# Patient Record
Sex: Male | Born: 1950
Health system: Southern US, Community
[De-identification: ages and names within clinical notes are randomized; demographics above are authoritative.]

## PROBLEM LIST (undated history)

## (undated) DIAGNOSIS — R519 Headache, unspecified: Secondary | ICD-10-CM

## (undated) DIAGNOSIS — G8929 Other chronic pain: Secondary | ICD-10-CM

## (undated) DIAGNOSIS — R55 Syncope and collapse: Secondary | ICD-10-CM

## (undated) DIAGNOSIS — G4733 Obstructive sleep apnea (adult) (pediatric): Secondary | ICD-10-CM

## (undated) DIAGNOSIS — R51 Headache: Secondary | ICD-10-CM

## (undated) DIAGNOSIS — R251 Tremor, unspecified: Secondary | ICD-10-CM

## (undated) DIAGNOSIS — F319 Bipolar disorder, unspecified: Secondary | ICD-10-CM

## (undated) HISTORY — DX: Tremor, unspecified: R25.1

## (undated) HISTORY — DX: Obstructive sleep apnea (adult) (pediatric): G47.33

## (undated) HISTORY — PX: CHOLECYSTECTOMY: SHX55

## (undated) HISTORY — DX: Bipolar disorder, unspecified: F31.9

## (undated) HISTORY — DX: Headache, unspecified: R51.9

## (undated) HISTORY — DX: Headache: R51

## (undated) HISTORY — PX: OTHER SURGICAL HISTORY: SHX169

## (undated) HISTORY — DX: Other chronic pain: G89.29

---

## 1988-06-17 DIAGNOSIS — C439 Malignant melanoma of skin, unspecified: Secondary | ICD-10-CM

## 1988-06-17 HISTORY — PX: MELANOMA EXCISION: SHX5266

## 1988-06-17 HISTORY — DX: Malignant melanoma of skin, unspecified: C43.9

## 2000-08-15 HISTORY — PX: CHOLECYSTECTOMY: SHX55

## 2000-08-25 ENCOUNTER — Emergency Department (HOSPITAL_COMMUNITY): Admission: EM | Admit: 2000-08-25 | Discharge: 2000-08-25 | Payer: Self-pay | Admitting: Emergency Medicine

## 2000-08-25 ENCOUNTER — Encounter: Payer: Self-pay | Admitting: Emergency Medicine

## 2000-09-10 ENCOUNTER — Encounter: Payer: Self-pay | Admitting: Family Medicine

## 2000-09-10 ENCOUNTER — Encounter: Admission: RE | Admit: 2000-09-10 | Discharge: 2000-09-10 | Payer: Self-pay | Admitting: Family Medicine

## 2000-09-23 ENCOUNTER — Encounter (INDEPENDENT_AMBULATORY_CARE_PROVIDER_SITE_OTHER): Payer: Self-pay | Admitting: Specialist

## 2000-09-23 ENCOUNTER — Other Ambulatory Visit: Admission: RE | Admit: 2000-09-23 | Discharge: 2000-09-23 | Payer: Self-pay | Admitting: General Surgery

## 2001-06-17 DIAGNOSIS — K5790 Diverticulosis of intestine, part unspecified, without perforation or abscess without bleeding: Secondary | ICD-10-CM

## 2001-06-17 DIAGNOSIS — D126 Benign neoplasm of colon, unspecified: Secondary | ICD-10-CM

## 2001-06-17 HISTORY — DX: Benign neoplasm of colon, unspecified: D12.6

## 2001-06-17 HISTORY — DX: Diverticulosis of intestine, part unspecified, without perforation or abscess without bleeding: K57.90

## 2001-12-11 ENCOUNTER — Encounter (INDEPENDENT_AMBULATORY_CARE_PROVIDER_SITE_OTHER): Payer: Self-pay | Admitting: Specialist

## 2001-12-11 ENCOUNTER — Ambulatory Visit (HOSPITAL_COMMUNITY): Admission: RE | Admit: 2001-12-11 | Discharge: 2001-12-11 | Payer: Self-pay | Admitting: Gastroenterology

## 2003-03-16 ENCOUNTER — Encounter: Payer: Self-pay | Admitting: Family Medicine

## 2003-03-16 ENCOUNTER — Encounter: Admission: RE | Admit: 2003-03-16 | Discharge: 2003-03-16 | Payer: Self-pay | Admitting: Family Medicine

## 2004-01-01 ENCOUNTER — Observation Stay (HOSPITAL_COMMUNITY): Admission: RE | Admit: 2004-01-01 | Discharge: 2004-01-02 | Payer: Self-pay | Admitting: Internal Medicine

## 2006-05-13 ENCOUNTER — Encounter: Admission: RE | Admit: 2006-05-13 | Discharge: 2006-05-13 | Payer: Self-pay | Admitting: Family Medicine

## 2006-09-11 ENCOUNTER — Encounter: Admission: RE | Admit: 2006-09-11 | Discharge: 2006-09-11 | Payer: Self-pay | Admitting: Neurosurgery

## 2007-09-25 ENCOUNTER — Encounter: Admission: RE | Admit: 2007-09-25 | Discharge: 2007-09-25 | Payer: Self-pay | Admitting: Neurosurgery

## 2010-11-02 NOTE — Discharge Summary (Signed)
NAME:  Drew Padilla, Drew Padilla                        ACCOUNT NO.:  [id number]   MEDICAL RECORD NO.:  [id number]                   PATIENT TYPE:  INP   LOCATION:  0346                                 FACILITY[facility]   PHYSICIAN:  Melissa L. Ladona Ridgel, MD               DATE OF BIRTH:  09/12/1950   DATE OF ADMISSION:  01/01/2004  DATE OF DISCHARGE:  01/02/2004                                 DISCHARGE SUMMARY   PRESENTING COMPLAINT:  Painful urination and rigors.   DISCHARGE DIAGNOSES:  1. Escherichia coli urinary tract infection with possible prostatitis.  2. Resolution of rigors, decreased fever, and improved overall general     health.  Blood cultures are pending.   DISCHARGE MEDICATIONS:  1. Cipro 500 mg p.o. b.i.d. x4 weeks to treat prostatitis.  2. Adderall 30 mg long-acting in the a.m. and 20 mg of short-acting at 3     p.m.  3. Prozac 20 mg daily.  4. Multivitamin once daily.  5. Pain management is as per the patient's home regime with Motrin.   ACTIVITY:  There are no activity restrictions.   DIET:  There are no dietary restrictions.   SPECIAL INSTRUCTIONS:  If he develops fever, chills, further shakes, nausea  or vomiting, or diarrhea, he should contact Dr. Manus Gunning or return to the  walk-in clinic.   FOLLOWUP:  Follow-up appointments should be made to see Dr. Manus Gunning on  Friday or early next week.   HISTORY OF PRESENT ILLNESS:  The patient is a 60 year old white male with no  past medical history for any prostate disease who presented to the walk-in  clinic with signs and symptoms of possible UTI with sepsis.  He described  painful urination and shakes.  Was admitted to the general medical floor and  treated with IV Cipro x24 hours at which time he felt significantly  improved, all of the symptomatology having resolved in terms of painful  urination.  He denied nausea, vomiting, or diarrhea and did not appear to  have orthostatic vital signs.  After speaking with his  primary care  physician we have agreed that we will give him a trial at home of Cipro p.o.  b.i.d. x4 weeks to treat his presumptive prostatitis with UTI.  Initial  urine cultures are showing E. coli.  Sensitivities are pending and will be  followed up by his primary care physician as will the blood cultures.  The  patient can decide with his primary care practitioner whether urology  consult would be appropriate for him.   PHYSICAL EXAMINATION:  VITAL SIGNS:  On the day of discharge his vital signs  were temperature of 97.9, blood pressure of 106/59, pulse of 84, respiratory  rate of 32.  He denied any dizziness.  He was 95% on room air.  GENERAL:  He appeared in no acute distress.  In fact, he appeared much  healthier.  He was up  and ambulating in his room.  He was able to eat and  had no symptoms of dizziness, nausea, vomiting, or diarrhea.  His general  examination remained no acute distress.  HEENT:  Pupils are equal, round, and reactive to light.  Extraocular muscles  are intact.  Mucous membranes are moist.  He is anicteric.  NECK:  Supple.  No JVD.  CHEST:  Clear to auscultation.  No rhonchi, rales, or wheezes.  CARDIOVASCULAR:  Regular rate and rhythm.  Positive S1, S2.  No S3, S4.  No  murmurs, rubs, or gallops.  ABDOMEN:  Soft, nontender, nondistended with positive bowel sounds.  NEUROLOGIC:  He is nonfocal.   At this time it was deemed that patient stable for follow-up as an  outpatient with his primary care physician.  He will be instructed on the  signs and symptoms of sepsis and his cultures will be followed.   CONDITION ON DISCHARGE:  Stable.                                               Melissa L. Ladona Ridgel, MD    MLT/MEDQ  D:  01/02/2004  T:  01/03/2004  Job:  [id number]   cc:   Bryan Lemma. Manus Gunning, M.D.  [address] [place]  [place] [address]  Fax: [phone]

## 2010-11-02 NOTE — H&P (Signed)
NAME:  Drew Padilla, Drew Padilla                        ACCOUNT NO.:  [id number]   MEDICAL RECORD NO.:  [id number]                   PATIENT TYPE:  INP   LOCATION:  0346                                 FACILITY:  [facility]   PHYSICIAN:  Melissa L. Ladona Ridgel, MD               DATE OF BIRTH:  [birth year]   DATE OF ADMISSION:  01/01/2004  DATE OF DISCHARGE:                                HISTORY & PHYSICAL   PRIMARY CARE PHYSICIAN:  Bryan Lemma. Ehinger, M.D.   CHIEF COMPLAINT:  Painful urination and rigor.   HISTORY OF PRESENT ILLNESS:  The patient is a 60 year old white male who did  say he was working out in his yard when he felt overheated, dizzy, and  sweaty.  He states he has had a decreased appetite for several days with no  nausea or vomiting.  The patient states he then developed painful urination,  usually occurring at the urethra tips during the stream of urine and some  suprapubic tenderness.  He denied any testicular pain.  He records reported  temperatures at home of 99 to 100.5.  Today symptoms were worse and he just  continued to feel poorly so he went to the walk-in clinic to see Dr.  Abigail Miyamoto.  He denied dizziness, nausea, or vomiting; however, just felt  really, really sick.  He states he has never had any symptoms like this  before.   REVIEW OF SYSTEMS:  Positive chills, positive low grade fevers, positive  aching, no nausea or  vomiting, positive decreased appetite, no chest pain, no shortness of  breath, no diarrhea, no melena, no hematochezia, and a stable weight.   PAST MEDICAL HISTORY:  Depression.   PAST SURGICAL HISTORY:  1. He had a gallbladder removed.  2. Melanoma removed from his right shoulder.   ALLERGIES:  No known drug allergies.   MEDICATIONS:  1. Prozac 30 mg daily.  2. Adderall long acting 30 mg in the a.m. and short acting 20 mg at 3 p.m.  3. Multivitamin.  4. Vitamin E.  5. Benadryl 100 mg q.h.s.   SOCIAL HISTORY:  He denies any tobacco use, said he quit  20 years ago.  He  has an occasional ethanol.  He denies any illicit drug use.  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   FAMILY HISTORY:  Mother is living, father is living.  Mother has no medical  problems, dad has hypertension and prostate disease.  He has three sisters,  one has breast cancer.   PHYSICAL EXAMINATION:  VITAL SIGNS:  Reveal a temperature of 99.1, blood  pressure 143/77, pulse 94, respiratory rate is 18.  He is 95% on room air.  His weight was 202.4.  Orthostatics on admission revealed lying systolic  blood pressure of 143/77 with a pulse of 94, sitting 141/73 with a pulse of  95, and standing 131/78 with a pulse of 96.  GENERAL:  He  is ill-appearing, well-developed, well-nourished, no acute  distress.  HEENT:  He is normocephalic, atraumatic.  Pupils are equal, round, and  reactive to light.  Extraocular movements are intact.  Mucous membranes are  moist.  NECK:  Supple.  There is no JVD, no lymph nodes were excessively enlarged;  however, there were some tender, mobile nodes in the submandibular area.  He  has no JVD and no bruits.  CHEST:  Clear to auscultation.  No rhonchi, rales, or wheezes.  CARDIOVASCULAR:  Regular rate and rhythm, positive S1/S2.  No S3 or S4.  No  murmurs, rubs, or gallops.  ABDOMEN:  Soft, nontender, nondistended, with positive bowel sounds.  EXTREMITIES:  No cyanosis, clubbing, or edema.  NEUROLOGIC:  He is nonfocal.  RECTAL:  We have deferred his rectal examination, as it has been performed  by Dr. Abigail Miyamoto and shows a slightly enlarged __________prostate that is  smooth without any areas of  firmness.   LABORATORY DATA:  On admission his CK was 75, MB was 0.9, troponin was 0.01.  Sodium 134, potassium 3.9, BUN 11, creatinine is 1.2.  LFTs were within  normal limits.  His urinalysis showed trace blood with moderate leukocyte  esterase.  There were 3-6 white blood cells.  His white count was 15.2.  Hemoglobin was 15.2, hematocrit 42.9, and  platelet count of 159 with 86%  neutrophils.   EKG revealed normal sinus rhythm with no ST/T-wave changes.  A chest x-ray  was not completed during the course of this hospitalization.   HISTORY OF PRESENT ILLNESS:  This is a 60 year old white male with fever,  chills, abdominal pain, and possible rigor.  He also has had persistent  malaise with decreased appetite, was found to have a definite urinary tract  infection on UA in the outpatient walk-in clinic.  His exam revealed a boggy  prostate.  Because of the rigors we were concerned that the patient may have  possible sepsis.  He was therefore admitted to the hospital for IV  antibiotics.  1. Infectious disease.  We will start him on Cipro IV after cultures are     redrawn.  2. We will rehydrate him.  There do not appear to be any signs or symptoms     of rhabdomyolysis.  This becomes important as the patient is on Adderall     due to experience what felt like heat prostration or working in the yard.  3. Psych.  The patient has a history of depression and the question of     attention deficit hyperactivity disorder has been raised because of the     Adderall.  He remains on Prozac and Adderall during this admission.  4. Insomnia.  Will continue his Benadryl.  5. Will follow up.  His EKG is normal sinus rhythm.  I therefore do not feel     that we need to further pursue this.                                               Melissa L. Ladona Ridgel, MD    MLT/MEDQ  D:  01/02/2004  T:  01/02/2004  Job:  [id number]   cc:   Bryan Lemma. Manus Gunning, M.D.  [address] [place]  [place] [address]  Fax: [phone]

## 2011-08-30 ENCOUNTER — Other Ambulatory Visit: Payer: Self-pay | Admitting: Family Medicine

## 2011-08-30 ENCOUNTER — Ambulatory Visit
Admission: RE | Admit: 2011-08-30 | Discharge: 2011-08-30 | Disposition: A | Discharge status: Home / Self Care | Payer: 59 | Source: Ambulatory Visit | Attending: Family Medicine | Admitting: Family Medicine

## 2011-08-30 DIAGNOSIS — R109 Unspecified abdominal pain: Secondary | ICD-10-CM

## 2011-08-30 MED ORDER — IOHEXOL 300 MG/ML  SOLN: 30.0000 mL | Freq: Once | INTRAMUSCULAR | Status: AC | PRN

## 2011-08-30 MED ORDER — IOHEXOL 300 MG/ML  SOLN
100.0000 mL | Freq: Once | INTRAMUSCULAR | Status: AC | PRN
  Administered 2011-08-30: 100 mL via INTRAVENOUS

## 2011-11-13 ENCOUNTER — Other Ambulatory Visit: Payer: Self-pay | Admitting: Gastroenterology

## 2011-11-13 ENCOUNTER — Other Ambulatory Visit (HOSPITAL_COMMUNITY): Payer: Self-pay | Admitting: Gastroenterology

## 2011-11-13 DIAGNOSIS — Z86010 Personal history of colonic polyps: Secondary | ICD-10-CM

## 2011-12-13 ENCOUNTER — Ambulatory Visit (HOSPITAL_COMMUNITY)
Admission: RE | Admit: 2011-12-13 | Discharge: 2011-12-13 | Disposition: A | Discharge status: Home / Self Care | Payer: BC Managed Care – PPO | Source: Ambulatory Visit | Attending: Gastroenterology | Admitting: Gastroenterology

## 2011-12-13 DIAGNOSIS — Z86010 Personal history of colonic polyps: Secondary | ICD-10-CM

## 2011-12-17 ENCOUNTER — Ambulatory Visit (HOSPITAL_COMMUNITY)
Admission: RE | Admit: 2011-12-17 | Discharge: 2011-12-17 | Disposition: A | Discharge status: Home / Self Care | Payer: BC Managed Care – PPO | Source: Ambulatory Visit | Attending: Gastroenterology | Admitting: Gastroenterology

## 2011-12-17 DIAGNOSIS — Z860100 Personal history of colon polyps, unspecified: Secondary | ICD-10-CM | POA: Insufficient documentation

## 2011-12-17 DIAGNOSIS — K5730 Diverticulosis of large intestine without perforation or abscess without bleeding: Secondary | ICD-10-CM | POA: Insufficient documentation

## 2012-02-13 ENCOUNTER — Encounter: Payer: Self-pay | Admitting: Pulmonary Disease

## 2012-02-13 ENCOUNTER — Encounter: Payer: Self-pay | Admitting: *Deleted

## 2012-02-14 ENCOUNTER — Encounter: Payer: Self-pay | Admitting: Pulmonary Disease

## 2012-02-14 ENCOUNTER — Ambulatory Visit (INDEPENDENT_AMBULATORY_CARE_PROVIDER_SITE_OTHER): Payer: BC Managed Care – PPO | Admitting: Pulmonary Disease

## 2012-02-14 VITALS — BP 142/82 | HR 87 | Temp 97.8°F | Ht 68.0 in | Wt 217.6 lb

## 2012-02-14 DIAGNOSIS — G4733 Obstructive sleep apnea (adult) (pediatric): Secondary | ICD-10-CM | POA: Insufficient documentation

## 2012-02-14 NOTE — Progress Notes (Signed)
  Subjective:    Patient ID: Drew Padilla, male    DOB: March 03, 1951, 61 y.o.   MRN: [id number]  HPI The patient is a 61 year old male who I am asked to see for possible obstructive sleep apnea.  He has been noted to have loud snoring by his wife, and she has also commented on an abnormal breathing pattern during sleep.  He has frequent awakenings at night, but feels that he is fairly rested 4 nights out of 7 when he arises.  He does note afternoon inappropriate daytime sleepiness if he has had a disrupted night.  This occurs 3/7 nights.  The patient will have sleepiness in the evenings if he tries to read, but has no issues watching television.  He notes some sleepiness with driving longer distances.  He states that his weight is neutral over the last 2 years, and his Epworth score today is 4.  Sleep Questionnaire: What time do you typically go to bed?( Between what hours) 10-11pm How long does it take you to fall asleep? 30 mins How many times during the night do you wake up? 6 What time do you get out of bed to start your day? 0715 Do you drive or operate heavy machinery in your occupation? No How much has your weight changed (up or down) over the past two years? (In pounds) 0 oz (0 kg) Have you ever had a sleep study before? No Do you currently use CPAP? No Do you wear oxygen at any time? No    Review of Systems  Constitutional: Positive for fatigue. Negative for fever and unexpected weight change.  HENT: Negative for ear pain, nosebleeds, congestion, sore throat, rhinorrhea, sneezing, trouble swallowing, dental problem, postnasal drip and sinus pressure.   Eyes: Negative for redness and itching.  Respiratory: Positive for cough. Negative for chest tightness, shortness of breath and wheezing.   Cardiovascular: Negative for palpitations and leg swelling.  Gastrointestinal: Negative for nausea and vomiting.  Genitourinary: Negative for dysuria.  Musculoskeletal: Negative for joint swelling.    Skin: Negative for rash.  Neurological: Positive for headaches.  Hematological: Does not bruise/bleed easily.  Psychiatric/Behavioral: Positive for dysphoric mood. The patient is not nervous/anxious.        Objective:   Physical Exam Constitutional:  Overweight male, no acute distress  HENT:  Nares patent without discharge, but narrowed bilat.  Oropharynx without exudate, palate and uvula are thick and elongated.  Narrowed posteriorly  Eyes:  Perrla, eomi, no scleral icterus  Neck:  No JVD, no TMG  Cardiovascular:  Normal rate, regular rhythm, no rubs or gallops.  No murmurs        Intact distal pulses  Pulmonary :  Normal breath sounds, no stridor or respiratory distress   No rales, rhonchi, or wheezing  Abdominal:  Soft, nondistended, bowel sounds present.  No tenderness noted.   Musculoskeletal:  No lower extremity edema noted.  Lymph Nodes:  No cervical lymphadenopathy noted  Skin:  No cyanosis noted  Neurologic:  Appears sleepy but is appropriate, moves all 4 extremities without obvious deficit.         Assessment & Plan:

## 2012-02-14 NOTE — Assessment & Plan Note (Signed)
The patient's history is very suggestive of clinically significant sleep disordered breathing.  I had a long discussion with him about the pathophysiology of sleep apnea, including its impact to his quality of life and cardiovascular health.  I have also explained its impact to underlying bipolar disease, and how many of the symptoms can overlap.  I have recommended that he have a sleep study for evaluation, and the patient is agreeable to this.  Will arrange followup once the results are available.

## 2012-02-14 NOTE — Patient Instructions (Addendum)
Will schedule for sleep study, and call you with results.

## 2012-03-08 ENCOUNTER — Ambulatory Visit (HOSPITAL_BASED_OUTPATIENT_CLINIC_OR_DEPARTMENT_OTHER): Payer: BC Managed Care – PPO | Attending: Pulmonary Disease | Admitting: Radiology

## 2012-03-08 VITALS — Ht 68.0 in | Wt 217.0 lb

## 2012-03-08 DIAGNOSIS — G4733 Obstructive sleep apnea (adult) (pediatric): Secondary | ICD-10-CM

## 2012-03-12 ENCOUNTER — Telehealth: Payer: Self-pay | Admitting: Pulmonary Disease

## 2012-03-12 NOTE — Telephone Encounter (Signed)
Please advise PSG results or when you want her to followup to review thanks!

## 2012-03-13 NOTE — Telephone Encounter (Signed)
Called, spoke with pt.  He is aware of below per [facility] and verbalized understanding of this.

## 2012-03-13 NOTE — Telephone Encounter (Signed)
Let pt know his sleep study was just done and will be interpreted probably this weekend.  Will let him know about f/u Monday or tues of next week.

## 2012-03-17 ENCOUNTER — Telehealth: Payer: Self-pay | Admitting: Pulmonary Disease

## 2012-03-17 DIAGNOSIS — G4710 Hypersomnia, unspecified: Secondary | ICD-10-CM

## 2012-03-17 DIAGNOSIS — G4730 Sleep apnea, unspecified: Secondary | ICD-10-CM

## 2012-03-17 NOTE — Telephone Encounter (Signed)
[id number] x1 

## 2012-03-18 ENCOUNTER — Ambulatory Visit (INDEPENDENT_AMBULATORY_CARE_PROVIDER_SITE_OTHER): Payer: BC Managed Care – PPO | Admitting: Pulmonary Disease

## 2012-03-18 ENCOUNTER — Encounter: Payer: Self-pay | Admitting: Pulmonary Disease

## 2012-03-18 VITALS — BP 122/82 | HR 93 | Ht 68.0 in | Wt 221.6 lb

## 2012-03-18 DIAGNOSIS — G4733 Obstructive sleep apnea (adult) (pediatric): Secondary | ICD-10-CM

## 2012-03-18 NOTE — Patient Instructions (Addendum)
Will setup cpap at moderate pressure level.  Please call if any tolerance issues. Work on weight loss followup with me in 6 weeks.

## 2012-03-18 NOTE — Telephone Encounter (Signed)
Pt was seen this morning to discuss sleep study results with KC.

## 2012-03-18 NOTE — Assessment & Plan Note (Signed)
The patient has been found to have moderate obstructive sleep apnea, and I have reviewed the various treatment options.  These include a trial of weight loss alone, upper airway surgery, dental appliance, and also CPAP.  After a long discussion, the patient wishes to try CPAP while he is working on weight loss. I will set the patient up on cpap at a moderate pressure level to allow for desensitization, and will troubleshoot the device over the next 4-6weeks if needed.  The pt is to call me if having issues with tolerance.  Will then optimize the pressure once patient is able to wear cpap on a consistent basis.

## 2012-03-18 NOTE — Progress Notes (Signed)
  Subjective:    Patient ID: Drew Padilla, male    DOB: 03-Sep-1950, 61 y.o.   MRN: [id number]  HPI The patient comes in today for followup of his recent sleep study.  He was found to have moderate obstructive sleep apnea, with an AHI of 29 events per hour and oxygen desaturation as low as 73%.  I have reviewed the study with him in detail, and answered all of his questions.   Review of Systems  Constitutional: Negative for fever and unexpected weight change.  HENT: Negative for ear pain, nosebleeds, congestion, sore throat, rhinorrhea, sneezing, trouble swallowing, dental problem, postnasal drip and sinus pressure.   Eyes: Negative for redness and itching.  Respiratory: Negative for cough, chest tightness, shortness of breath and wheezing.   Cardiovascular: Negative for palpitations and leg swelling.  Gastrointestinal: Negative for nausea and vomiting.  Genitourinary: Negative for dysuria.  Musculoskeletal: Negative for joint swelling.  Skin: Negative for rash.  Neurological: Positive for headaches.  Hematological: Does not bruise/bleed easily.  Psychiatric/Behavioral: Positive for dysphoric mood. The patient is not nervous/anxious.        Objective:   Physical Exam Overweight male in no acute distress Nose without purulence or discharge noted Lower extremities without significant edema, no cyanosis Alert and oriented, moves all 4 extremities.       Assessment & Plan:

## 2012-03-18 NOTE — Procedures (Signed)
Drew Padilla, Drew Padilla              ACCOUNT NO.:  [id number]  MEDICAL RECORD NO.:  [id number]          PATIENT TYPE:  OUT  LOCATION:  SLEEP CENTER                 FACILITY:  [facility]  PHYSICIAN:  Barbaraann Share, MD,FCCPDATE OF BIRTH:  1950/10/24  DATE OF STUDY:  03/08/2012                           NOCTURNAL POLYSOMNOGRAM  REFERRING PHYSICIAN:  Barbaraann Share, MD,FCCP  LOCATION:  Sleep Lab.  INDICATION FOR STUDY:  Hypersomnia with sleep apnea.  EPWORTH SLEEPINESS SCORE:  4.  MEDICATIONS:  SLEEP ARCHITECTURE:  The patient had a total sleep time of 346 minutes with no slow-wave sleep and only 18 minutes of REM.  Sleep onset latency was normal at 7.5 minutes and REM onset was prolonged at 130 minutes. Sleep efficiency was adequate at 92%.  RESPIRATORY DATA:  The patient was found to have 127 apneas and also 40 obstructive hypopneas, giving him an apnea-hypopnea index of 29 events per hour.  The events occurred in all body positions, but were clearly worse during REM.  OXYGEN DATA:  There was O2 desaturation as low as 73% with the patient's obstructive events.  CARDIAC DATA:  No clinically significant arrhythmias were noted.  MOVEMENT-PARASOMNIA:  The patient had no significant leg jerks or other abnormal behaviors seen.  IMPRESSIONS-RECOMMENDATIONS:  Moderate obstructive sleep apnea/hypopnea syndrome, with an AHI of 29 events per hour and O2 desaturation as low as 73%.  Treatment for this degree of sleep apnea can include a trial of weight loss alone, upper airway surgery, dental appliance, and also continuous positive airway pressure.  Clinical correlation is suggested.    Barbaraann Share, MD,FCCP Diplomate, American Board of Sleep Medicine   [facility]/MEDQ  D:  03/17/2012 08:51:54  T:  03/18/2012 01:31:27  Job:  [id number]

## 2012-04-29 ENCOUNTER — Encounter: Payer: Self-pay | Admitting: Pulmonary Disease

## 2012-04-29 ENCOUNTER — Ambulatory Visit (INDEPENDENT_AMBULATORY_CARE_PROVIDER_SITE_OTHER): Payer: BC Managed Care – PPO | Admitting: Pulmonary Disease

## 2012-04-29 VITALS — BP 120/80 | HR 74 | Temp 97.5°F | Ht 68.0 in | Wt 206.6 lb

## 2012-04-29 DIAGNOSIS — G4733 Obstructive sleep apnea (adult) (pediatric): Secondary | ICD-10-CM

## 2012-04-29 NOTE — Patient Instructions (Addendum)
Continue with cpap, and we will use the auto setting in your machine to optimize your pressure. Work on weight loss followup with me in 6mos.

## 2012-04-29 NOTE — Progress Notes (Signed)
  Subjective:    Patient ID: Drew Padilla, male    DOB: December 30, 1950, 61 y.o.   MRN: [id number]  HPI The patient comes in today for followup of his obstructive sleep apnea.  He was started on CPAP at the last visit, and has seen improvement in his sleep and daytime alertness.  His wife states that he has stopped snoring and has quit wearing earplugs.  The patient is having no issues with mask fit or pressure.   Review of Systems  Constitutional: Negative for fever and unexpected weight change.  HENT: Negative for ear pain, nosebleeds, congestion, sore throat, rhinorrhea, sneezing, trouble swallowing, dental problem, postnasal drip and sinus pressure.   Eyes: Negative for redness and itching.  Respiratory: Negative for cough, chest tightness, shortness of breath and wheezing.   Cardiovascular: Negative for palpitations and leg swelling.  Gastrointestinal: Negative for nausea and vomiting.  Genitourinary: Negative for dysuria.  Musculoskeletal: Negative for joint swelling.  Skin: Negative for rash.  Neurological: Positive for headaches.  Hematological: Does not bruise/bleed easily.  Psychiatric/Behavioral: Negative for dysphoric mood. The patient is not nervous/anxious.        Objective:   Physical Exam Overweight male in no acute distress Nose without purulence or discharge noted No skin breakdown or pressure necrosis from the CPAP mask Lower extremities without edema, no cyanosis Alert and oriented, moves all 4 extremities.       Assessment & Plan:

## 2012-04-29 NOTE — Assessment & Plan Note (Signed)
The patient is doing very well with CPAP, and his download shows an excellent compliance and control of his AHI.  He is having no mask or pressure issues, and at this point will need to optimize his pressure for him.  I have also encouraged him to work aggressively on weight loss. Care Plan:  At this point, will arrange for the patient's machine to be changed over to auto mode for 2 weeks to optimize their pressure.  I will review the downloaded data once sent by dme, and also evaluate for compliance, leaks, and residual osa.  I will call the patient and dme to discuss the results, and have the patient's machine set appropriately.  This will serve as the pt's cpap pressure titration.

## 2012-07-04 ENCOUNTER — Other Ambulatory Visit: Payer: Self-pay | Admitting: Pulmonary Disease

## 2012-07-04 DIAGNOSIS — G4733 Obstructive sleep apnea (adult) (pediatric): Secondary | ICD-10-CM

## 2012-10-28 ENCOUNTER — Ambulatory Visit: Payer: BC Managed Care – PPO | Admitting: Pulmonary Disease

## 2015-11-16 DIAGNOSIS — R55 Syncope and collapse: Secondary | ICD-10-CM

## 2015-11-16 HISTORY — DX: Syncope and collapse: R55

## 2015-12-11 ENCOUNTER — Observation Stay (HOSPITAL_COMMUNITY)
Admission: EM | Admit: 2015-12-11 | Discharge: 2015-12-13 | Disposition: A | Discharge status: Home / Self Care | Payer: 59 | Attending: Internal Medicine | Admitting: Internal Medicine

## 2015-12-11 ENCOUNTER — Emergency Department (HOSPITAL_COMMUNITY): Payer: 59

## 2015-12-11 ENCOUNTER — Other Ambulatory Visit: Payer: Self-pay

## 2015-12-11 ENCOUNTER — Encounter (HOSPITAL_COMMUNITY): Payer: Self-pay

## 2015-12-11 DIAGNOSIS — F3170 Bipolar disorder, currently in remission, most recent episode unspecified: Secondary | ICD-10-CM | POA: Diagnosis not present

## 2015-12-11 DIAGNOSIS — M25572 Pain in left ankle and joints of left foot: Secondary | ICD-10-CM | POA: Diagnosis not present

## 2015-12-11 DIAGNOSIS — M25571 Pain in right ankle and joints of right foot: Secondary | ICD-10-CM

## 2015-12-11 DIAGNOSIS — G4733 Obstructive sleep apnea (adult) (pediatric): Secondary | ICD-10-CM | POA: Insufficient documentation

## 2015-12-11 DIAGNOSIS — R55 Syncope and collapse: Principal | ICD-10-CM | POA: Diagnosis present

## 2015-12-11 DIAGNOSIS — W19XXXA Unspecified fall, initial encounter: Secondary | ICD-10-CM | POA: Insufficient documentation

## 2015-12-11 DIAGNOSIS — E039 Hypothyroidism, unspecified: Secondary | ICD-10-CM | POA: Diagnosis not present

## 2015-12-11 DIAGNOSIS — W228XXA Striking against or struck by other objects, initial encounter: Secondary | ICD-10-CM | POA: Diagnosis not present

## 2015-12-11 DIAGNOSIS — F319 Bipolar disorder, unspecified: Secondary | ICD-10-CM | POA: Diagnosis not present

## 2015-12-11 HISTORY — DX: Syncope and collapse: R55

## 2015-12-11 LAB — BASIC METABOLIC PANEL
ANION GAP: 7 (ref 5–15)
BUN: 8 mg/dL (ref 6–20)
CHLORIDE: 110 mmol/L (ref 101–111)
CO2: 21 mmol/L — AB (ref 22–32)
Calcium: 9.7 mg/dL (ref 8.9–10.3)
Creatinine, Ser: 1.12 mg/dL (ref 0.61–1.24)
GFR calc Af Amer: >60 mL/min (ref >60)
GFR calc non Af Amer: >60 mL/min (ref >60)
GLUCOSE: 90 mg/dL (ref 65–99)
POTASSIUM: 3.8 mmol/L (ref 3.5–5.1)
Sodium: 138 mmol/L (ref 135–145)

## 2015-12-11 LAB — I-STAT TROPONIN, ED: Troponin i, poc: 0 ng/mL (ref 0.00–0.08)

## 2015-12-11 LAB — CBC WITH DIFFERENTIAL/PLATELET
BASOS ABS: 0 10*3/uL (ref 0.0–0.1)
Basophils Relative: 0 %
Eosinophils Absolute: 0.1 10*3/uL (ref 0.0–0.7)
Eosinophils Relative: 2 %
HEMATOCRIT: 42.6 % (ref 39.0–52.0)
Hemoglobin: 14.6 g/dL (ref 13.0–17.0)
LYMPHS ABS: 1.3 10*3/uL (ref 0.7–4.0)
LYMPHS PCT: 21 %
MCH: 29.6 pg (ref 26.0–34.0)
MCHC: 34.3 g/dL (ref 30.0–36.0)
MCV: 86.2 fL (ref 78.0–100.0)
MONO ABS: 0.4 10*3/uL (ref 0.1–1.0)
MONOS PCT: 6 %
NEUTROS ABS: 4.6 10*3/uL (ref 1.7–7.7)
Neutrophils Relative %: 71 %
Platelets: 122 10*3/uL — ABNORMAL LOW (ref 150–400)
RBC: 4.94 MIL/uL (ref 4.22–5.81)
RDW: 12.6 % (ref 11.5–15.5)
WBC: 6.4 10*3/uL (ref 4.0–10.5)

## 2015-12-11 LAB — TROPONIN I

## 2015-12-11 MED ORDER — FENTANYL CITRATE (PF) 100 MCG/2ML IJ SOLN
50.0000 ug | Freq: Once | INTRAMUSCULAR | Status: AC
  Administered 2015-12-11: 50 ug via INTRAVENOUS
  Filled 2015-12-11: qty 2

## 2015-12-11 MED ORDER — ONDANSETRON HCL 4 MG/2ML IJ SOLN: 4.0000 mg | Freq: Four times a day (QID) | INTRAMUSCULAR | Status: DC | PRN

## 2015-12-11 MED ORDER — LITHIUM CARBONATE ER 450 MG PO TBCR
750.0000 mg | EXTENDED_RELEASE_TABLET | Freq: Every evening | ORAL | Status: DC
  Administered 2015-12-11 – 2015-12-12 (×2): 750 mg via ORAL
  Filled 2015-12-11 (×2): qty 1

## 2015-12-11 MED ORDER — ONDANSETRON HCL 4 MG/2ML IJ SOLN
4.0000 mg | Freq: Once | INTRAMUSCULAR | Status: AC
  Administered 2015-12-11: 4 mg via INTRAVENOUS
  Filled 2015-12-11: qty 2

## 2015-12-11 MED ORDER — TRAMADOL HCL 50 MG PO TABS
50.0000 mg | ORAL_TABLET | Freq: Four times a day (QID) | ORAL | Status: DC | PRN
  Administered 2015-12-12 – 2015-12-13 (×4): 50 mg via ORAL
  Filled 2015-12-11 (×4): qty 1

## 2015-12-11 MED ORDER — ACETAMINOPHEN 650 MG RE SUPP: 650.0000 mg | Freq: Four times a day (QID) | RECTAL | Status: DC | PRN

## 2015-12-11 MED ORDER — LAMOTRIGINE 100 MG PO TABS
200.0000 mg | ORAL_TABLET | Freq: Every morning | ORAL | Status: DC
  Administered 2015-12-12 – 2015-12-13 (×2): 200 mg via ORAL
  Filled 2015-12-11: qty 1
  Filled 2015-12-11: qty 2

## 2015-12-11 MED ORDER — SODIUM CHLORIDE 0.9% FLUSH
3.0000 mL | Freq: Two times a day (BID) | INTRAVENOUS | Status: DC
  Administered 2015-12-11 – 2015-12-13 (×4): 3 mL via INTRAVENOUS

## 2015-12-11 MED ORDER — OXYCODONE-ACETAMINOPHEN 5-325 MG PO TABS
1.0000 | ORAL_TABLET | Freq: Once | ORAL | Status: AC
  Administered 2015-12-11: 1 via ORAL
  Filled 2015-12-11: qty 1

## 2015-12-11 MED ORDER — TRAZODONE HCL 100 MG PO TABS
100.0000 mg | ORAL_TABLET | Freq: Every day | ORAL | Status: DC
  Administered 2015-12-11 – 2015-12-12 (×2): 100 mg via ORAL
  Filled 2015-12-11 (×2): qty 1

## 2015-12-11 MED ORDER — LIOTHYRONINE SODIUM 5 MCG PO TABS: 37.5000 ug | ORAL_TABLET | Freq: Every day | ORAL | Status: DC

## 2015-12-11 MED ORDER — ALPRAZOLAM 0.5 MG PO TABS: 0.5000 mg | ORAL_TABLET | Freq: Two times a day (BID) | ORAL | Status: DC | PRN

## 2015-12-11 MED ORDER — ACETAMINOPHEN 325 MG PO TABS
650.0000 mg | ORAL_TABLET | Freq: Four times a day (QID) | ORAL | Status: DC | PRN
  Administered 2015-12-11 – 2015-12-12 (×2): 650 mg via ORAL
  Filled 2015-12-11 (×2): qty 2

## 2015-12-11 MED ORDER — ONDANSETRON HCL 4 MG PO TABS: 4.0000 mg | ORAL_TABLET | Freq: Four times a day (QID) | ORAL | Status: DC | PRN

## 2015-12-11 MED ORDER — LAMOTRIGINE 100 MG PO TABS
100.0000 mg | ORAL_TABLET | Freq: Every evening | ORAL | Status: DC
  Administered 2015-12-11 – 2015-12-12 (×2): 100 mg via ORAL
  Filled 2015-12-11 (×2): qty 1

## 2015-12-11 MED ORDER — LAMOTRIGINE 200 MG PO TABS: 200.0000 mg | ORAL_TABLET | Freq: Two times a day (BID) | ORAL | Status: DC

## 2015-12-11 MED ORDER — HEPARIN SODIUM (PORCINE) 5000 UNIT/ML IJ SOLN
5000.0000 [IU] | Freq: Three times a day (TID) | INTRAMUSCULAR | Status: DC
  Administered 2015-12-11 – 2015-12-13 (×5): 5000 [IU] via SUBCUTANEOUS
  Filled 2015-12-11 (×5): qty 1

## 2015-12-11 MED ORDER — LITHIUM CARBONATE ER 450 MG PO TBCR: 750.0000 mg | EXTENDED_RELEASE_TABLET | Freq: Every evening | ORAL | Status: DC

## 2015-12-11 MED ORDER — LIOTHYRONINE SODIUM 5 MCG PO TABS
37.5000 ug | ORAL_TABLET | Freq: Every day | ORAL | Status: DC
  Administered 2015-12-12 – 2015-12-13 (×2): 37.5 ug via ORAL
  Filled 2015-12-11 (×2): qty 8

## 2015-12-11 NOTE — Progress Notes (Signed)
Pt arrived to unit from ER.  Telemetry applied and CCMD notified.  Orthostatic vital signs obtained and documented.  Pt oriented to room including call light and telephone.  Pt stable at this time.  Will cont to monitor.
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
## 2015-12-12 ENCOUNTER — Observation Stay (HOSPITAL_BASED_OUTPATIENT_CLINIC_OR_DEPARTMENT_OTHER): Payer: 59

## 2015-12-12 ENCOUNTER — Encounter (HOSPITAL_COMMUNITY): Payer: Self-pay | Admitting: General Practice

## 2015-12-12 DIAGNOSIS — R55 Syncope and collapse: Secondary | ICD-10-CM

## 2015-12-12 DIAGNOSIS — F3170 Bipolar disorder, currently in remission, most recent episode unspecified: Secondary | ICD-10-CM | POA: Diagnosis not present

## 2015-12-12 LAB — COMPREHENSIVE METABOLIC PANEL
ALBUMIN: 3.8 g/dL (ref 3.5–5.0)
ALT: 15 U/L — ABNORMAL LOW (ref 17–63)
AST: 18 U/L (ref 15–41)
Alkaline Phosphatase: 45 U/L (ref 38–126)
Anion gap: 7 (ref 5–15)
BUN: 10 mg/dL (ref 6–20)
CHLORIDE: 104 mmol/L (ref 101–111)
CO2: 28 mmol/L (ref 22–32)
Calcium: 9.6 mg/dL (ref 8.9–10.3)
Creatinine, Ser: 1.24 mg/dL (ref 0.61–1.24)
GFR calc Af Amer: >60 mL/min (ref >60)
GFR, EST NON AFRICAN AMERICAN: 60 mL/min — AB (ref >60)
Glucose, Bld: 92 mg/dL (ref 65–99)
POTASSIUM: 4.4 mmol/L (ref 3.5–5.1)
SODIUM: 139 mmol/L (ref 135–145)
TOTAL PROTEIN: 5.6 g/dL — AB (ref 6.5–8.1)
Total Bilirubin: 0.8 mg/dL (ref 0.3–1.2)

## 2015-12-12 LAB — ECHOCARDIOGRAM COMPLETE
CHL CUP MV DEC (S): 268
E/e' ratio: 5.8
EWDT: 268 ms
FS: 30 % (ref 28–44)
Height: 68 in
IV/PV OW: 1.09
LA diam end sys: 31 mm
LA vol: 49.2 mL
LADIAMINDEX: 1.57 cm/m2
LASIZE: 31 mm
LAVOLA4C: 51.8 mL
LAVOLIN: 24.8 mL/m2
LV E/e' medial: 5.8
LV dias vol index: 35 mL/m2
LV e' LATERAL: 10.7 cm/s
LV sys vol: 31 mL (ref 21–61)
LVDIAVOL: 70 mL (ref 62–150)
LVEEAVG: 5.8
LVOT area: 4.15 cm2
LVOTD: 23 mm
LVSYSVOLIN: 16 mL/m2
MV pk A vel: 61.6 m/s
MV pk E vel: 62.1 m/s
PW: 9.93 mm — AB (ref 0.6–1.1)
RV TAPSE: 31.3 mm
Simpson's disk: 55
Stroke v: 39 ml
TDI e' lateral: 10.7
TDI e' medial: 7.18
Weight: 2987.67 oz

## 2015-12-12 LAB — CBC
HEMATOCRIT: 40.8 % (ref 39.0–52.0)
Hemoglobin: 13.8 g/dL (ref 13.0–17.0)
MCH: 29.7 pg (ref 26.0–34.0)
MCHC: 33.8 g/dL (ref 30.0–36.0)
MCV: 87.7 fL (ref 78.0–100.0)
PLATELETS: 120 10*3/uL — AB (ref 150–400)
RBC: 4.65 MIL/uL (ref 4.22–5.81)
RDW: 12.7 % (ref 11.5–15.5)
WBC: 6.7 10*3/uL (ref 4.0–10.5)

## 2015-12-12 LAB — TROPONIN I

## 2015-12-12 LAB — TSH: TSH: 2.122 u[IU]/mL (ref 0.350–4.500)

## 2015-12-12 LAB — GLUCOSE, CAPILLARY: GLUCOSE-CAPILLARY: 95 mg/dL (ref 65–99)
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
## 2015-12-12 NOTE — Progress Notes (Signed)
Echocardiogram 2D Echocardiogram has been performed.  Drew Padilla 12/12/2015, 9:52 AM

## 2015-12-13 DIAGNOSIS — F319 Bipolar disorder, unspecified: Secondary | ICD-10-CM | POA: Diagnosis not present

## 2015-12-13 DIAGNOSIS — E039 Hypothyroidism, unspecified: Secondary | ICD-10-CM

## 2015-12-13 DIAGNOSIS — R55 Syncope and collapse: Secondary | ICD-10-CM | POA: Diagnosis not present

## 2015-12-13 DIAGNOSIS — F3170 Bipolar disorder, currently in remission, most recent episode unspecified: Secondary | ICD-10-CM

## 2015-12-13 DIAGNOSIS — G4733 Obstructive sleep apnea (adult) (pediatric): Secondary | ICD-10-CM | POA: Diagnosis not present

## 2015-12-13 LAB — GLUCOSE, CAPILLARY: Glucose-Capillary: 105 mg/dL — ABNORMAL HIGH (ref 65–99)

## 2015-12-13 MED ORDER — TRAMADOL HCL 50 MG PO TABS: 50.0000 mg | ORAL_TABLET | Freq: Four times a day (QID) | ORAL | Status: DC | PRN

## 2015-12-13 NOTE — Discharge Instructions (Signed)
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
## 2015-12-18 ENCOUNTER — Ambulatory Visit (INDEPENDENT_AMBULATORY_CARE_PROVIDER_SITE_OTHER): Payer: 59

## 2015-12-18 DIAGNOSIS — R55 Syncope and collapse: Secondary | ICD-10-CM | POA: Diagnosis not present

## 2016-10-08 DIAGNOSIS — H5203 Hypermetropia, bilateral: Secondary | ICD-10-CM | POA: Diagnosis not present

## 2016-10-08 DIAGNOSIS — H524 Presbyopia: Secondary | ICD-10-CM | POA: Diagnosis not present

## 2016-10-08 DIAGNOSIS — H2513 Age-related nuclear cataract, bilateral: Secondary | ICD-10-CM | POA: Diagnosis not present

## 2016-10-08 DIAGNOSIS — H52203 Unspecified astigmatism, bilateral: Secondary | ICD-10-CM | POA: Diagnosis not present

## 2017-01-28 DIAGNOSIS — F3176 Bipolar disorder, in full remission, most recent episode depressed: Secondary | ICD-10-CM | POA: Diagnosis not present

## 2017-01-28 DIAGNOSIS — Z79899 Other long term (current) drug therapy: Secondary | ICD-10-CM | POA: Diagnosis not present

## 2017-03-17 DIAGNOSIS — F331 Major depressive disorder, recurrent, moderate: Secondary | ICD-10-CM | POA: Diagnosis not present
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
## 2017-09-08 DIAGNOSIS — F331 Major depressive disorder, recurrent, moderate: Secondary | ICD-10-CM | POA: Diagnosis not present

## 2017-09-15 DIAGNOSIS — E559 Vitamin D deficiency, unspecified: Secondary | ICD-10-CM | POA: Diagnosis not present

## 2017-09-15 DIAGNOSIS — F314 Bipolar disorder, current episode depressed, severe, without psychotic features: Secondary | ICD-10-CM | POA: Diagnosis not present

## 2017-10-14 DIAGNOSIS — H2513 Age-related nuclear cataract, bilateral: Secondary | ICD-10-CM | POA: Diagnosis not present

## 2017-10-27 DIAGNOSIS — F331 Major depressive disorder, recurrent, moderate: Secondary | ICD-10-CM | POA: Diagnosis not present

## 2018-03-09 DIAGNOSIS — F331 Major depressive disorder, recurrent, moderate: Secondary | ICD-10-CM | POA: Diagnosis not present

## 2018-03-26 ENCOUNTER — Other Ambulatory Visit: Payer: Self-pay

## 2018-03-26 MED ORDER — PRAMIPEXOLE DIHYDROCHLORIDE 0.25 MG PO TABS: 0.2500 mg | ORAL_TABLET | Freq: Two times a day (BID) | ORAL | 1 refills | Status: DC

## 2018-04-22 DIAGNOSIS — C44111 Basal cell carcinoma of skin of unspecified eyelid, including canthus: Secondary | ICD-10-CM | POA: Diagnosis not present

## 2018-04-22 DIAGNOSIS — C44319 Basal cell carcinoma of skin of other parts of face: Secondary | ICD-10-CM | POA: Diagnosis not present

## 2018-04-22 DIAGNOSIS — L821 Other seborrheic keratosis: Secondary | ICD-10-CM | POA: Diagnosis not present

## 2018-06-11 ENCOUNTER — Other Ambulatory Visit: Payer: Self-pay | Admitting: Psychiatry

## 2018-06-12 ENCOUNTER — Other Ambulatory Visit: Payer: Self-pay

## 2018-06-12 MED ORDER — LIOTHYRONINE SODIUM 25 MCG PO TABS: 37.5000 ug | ORAL_TABLET | Freq: Every day | ORAL | 0 refills | Status: DC

## 2018-06-12 MED ORDER — MIRTAZAPINE 7.5 MG PO TABS: 7.5000 mg | ORAL_TABLET | Freq: Every day | ORAL | 0 refills | Status: DC

## 2018-06-12 NOTE — Telephone Encounter (Signed)
Need to review paper chart  

## 2018-07-06 DIAGNOSIS — C44311 Basal cell carcinoma of skin of nose: Secondary | ICD-10-CM | POA: Diagnosis not present

## 2018-07-15 ENCOUNTER — Ambulatory Visit (INDEPENDENT_AMBULATORY_CARE_PROVIDER_SITE_OTHER): Payer: Medicare Other | Admitting: Psychiatry

## 2018-07-15 ENCOUNTER — Encounter: Payer: Self-pay | Admitting: Psychiatry

## 2018-07-15 DIAGNOSIS — F5105 Insomnia due to other mental disorder: Secondary | ICD-10-CM | POA: Diagnosis not present

## 2018-07-15 DIAGNOSIS — F3130 Bipolar disorder, current episode depressed, mild or moderate severity, unspecified: Secondary | ICD-10-CM | POA: Diagnosis not present

## 2018-07-15 MED ORDER — HYDROXYZINE HCL 25 MG PO TABS: 25.0000 mg | ORAL_TABLET | Freq: Every evening | ORAL | 1 refills | Status: DC | PRN

## 2018-07-15 NOTE — Progress Notes (Signed)
Drew Padilla [id number] 12-30-1950 68 y.o.  Subjective:   [name] is a 68 y.o. (DOB 1950-10-06) male.  Chief Complaint:  Chief Complaint  Patient presents with  . Follow-up    Medication Management  . Sleeping Problem    HPI  Seen last in [place] presents to the office today for follow-up of bipolar and insomnia.  Doing OK except insomnia with EMA 4 AM after 4-5 hours.  Total 6 hours.  Gets OOB and reads.  Tried increase trazodone and it didn't help much.  No late caffeine. Tried mirtazapine without benefit. Mood is fair.  Wife's health cont to decline and a lot of health care for her.  No anxiety when awakens.  Occ Xanax.  Patient reports stable mood and denies depressed or irritable moods.  Patient denies any recent difficulty with anxiety.  Patient denies difficulty with sleep initiation or maintenance. Denies appetite disturbance.  Patient reports that energy and motivation have been good.  Patient denies any difficulty with concentration.  Patient denies any suicidal ideation.  Psych med history:  Ambien CR, trazodone, mirtazapine, Lunesta,   Review of Systems:  Review of Systems  Neurological: Negative for tremors and weakness.  Psychiatric/Behavioral: Positive for sleep disturbance. Negative for agitation, behavioral problems, confusion, decreased concentration, dysphoric mood, hallucinations, self-injury and suicidal ideas. The patient is not nervous/anxious and is not hyperactive.     Medications: I have reviewed the patient's current medications.  Current Outpatient Medications  Medication Sig Dispense Refill  . ALPRAZolam (XANAX) 0.5 MG tablet Take 0.5-1 mg by mouth 2 (two) times daily as needed for anxiety.    . lamoTRIgine (LAMICTAL) 200 MG tablet Take 200 mg by mouth 2 (two) times daily. Pt takes 200mg  in am and 100mg  in pm    . liothyronine (CYTOMEL) 25 MCG tablet Take 1.5 tablets (37.5 mcg total) by mouth daily. 135  tablet 0  . lithium carbonate 150 MG capsule TAKE 5 CAPSULES BY MOUTH AT NOON. 450 capsule 0  . mirtazapine (REMERON) 7.5 MG tablet Take 1 tablet (7.5 mg total) by mouth at bedtime. 90 tablet 0  . pramipexole (MIRAPEX) 0.25 MG tablet Take 1 tablet (0.25 mg total) by mouth 2 (two) times daily. 180 tablet 1  . traZODone (DESYREL) 100 MG tablet Take 100 mg by mouth at bedtime.     Marland Kitchen doxylamine, Sleep, (UNISOM) 25 MG tablet Take 25 mg by mouth at bedtime as needed for sleep.    . hydrOXYzine (ATARAX/VISTARIL) 25 MG tablet Take 1-2 tablets (25-50 mg total) by mouth at bedtime as needed. 60 tablet 1  . traMADol (ULTRAM) 50 MG tablet Take 1 tablet (50 mg total) by mouth every 6 (six) hours as needed for moderate pain (or Headache unrelieved by tylenol). (Patient not taking: Reported on 07/15/2018) 30 tablet 0   No current facility-administered medications for this visit.     Medication Side Effects: Other: tremor managed with B6  Allergies: No Known Allergies  Past Medical History:  Diagnosis Date  . Bipolar disorder ([place])    with primary depressive symptoms  . Chronic headaches   . Syncope 11/2015    Family History  Problem Relation Age of Onset  . Sleep apnea Father     Social History   Socioeconomic History  . Marital status: Married    Spouse name: Not on file  . Number of children: Not on file  . Years of education: Not on file  .  Highest education level: Not on file  Occupational History  . Occupation: funeral Location manager: [employer]  Social Needs  . Financial resource strain: Not on file  . Food insecurity:    Worry: Not on file    Inability: Not on file  . Transportation needs:    Medical: Not on file    Non-medical: Not on file  Tobacco Use  . Smoking status: Never Smoker  . Smokeless tobacco: Never Used  Substance and Sexual Activity  . Alcohol use: No  . Drug use: No  . Sexual activity: Not on file  Lifestyle  . Physical activity:    Days per  week: Not on file    Minutes per session: Not on file  . Stress: Not on file  Relationships  . Social connections:    Talks on phone: Not on file    Gets together: Not on file    Attends religious service: Not on file    Active member of club or organization: Not on file    Attends meetings of clubs or organizations: Not on file    Relationship status: Not on file  . Intimate partner violence:    Fear of current or ex partner: Not on file    Emotionally abused: Not on file    Physically abused: Not on file    Forced sexual activity: Not on file  Other Topics Concern  . Not on file  Social History Narrative  . Not on file    Past Medical History, Surgical history, Social history, and Family history were reviewed and updated as appropriate.   Please see review of systems for further details on the patient's review from today.   Objective:   Physical Exam:  There were no vitals taken for this visit.  Physical Exam Constitutional:      General: He is not in acute distress.    Appearance: He is well-developed.  Musculoskeletal:        General: No deformity.  Neurological:     Mental Status: He is alert and oriented to person, place, and time.     Motor: No tremor.     Coordination: Coordination normal.     Gait: Gait normal.  Psychiatric:        Attention and Perception: Attention normal. He is attentive.        Mood and Affect: Mood normal. Mood is not anxious or depressed. Affect is not labile, blunt, angry or inappropriate.        Speech: Speech normal.        Behavior: Behavior normal.        Thought Content: Thought content normal. Thought content does not include homicidal or suicidal ideation. Thought content does not include homicidal or suicidal plan.        Cognition and Memory: Cognition normal.        Judgment: Judgment normal.     Comments: Insight is good.     Lab Review:     Component Value Date/Time   NA 139 12/12/2015 0702   K 4.4 12/12/2015 0702    CL 104 12/12/2015 0702   CO2 28 12/12/2015 0702   GLUCOSE 92 12/12/2015 0702   BUN 10 12/12/2015 0702   CREATININE 1.24 12/12/2015 0702   CALCIUM 9.6 12/12/2015 0702   PROT 5.6 (L) 12/12/2015 0702   ALBUMIN 3.8 12/12/2015 0702   AST 18 12/12/2015 0702   ALT 15 (L) 12/12/2015 0702   ALKPHOS 45  12/12/2015 0702   BILITOT 0.8 12/12/2015 0702   GFRNONAA 60 (L) 12/12/2015 0702   GFRAA >60 12/12/2015 0702       Component Value Date/Time   WBC 6.7 12/12/2015 0702   RBC 4.65 12/12/2015 0702   HGB 13.8 12/12/2015 0702   HCT 40.8 12/12/2015 0702   PLT 120 (L) 12/12/2015 0702   MCV 87.7 12/12/2015 0702   MCH 29.7 12/12/2015 0702   MCHC 33.8 12/12/2015 0702   RDW 12.7 12/12/2015 0702   LYMPHSABS 1.3 12/11/2015 1348   MONOABS 0.4 12/11/2015 1348   EOSABS 0.1 12/11/2015 1348   BASOSABS 0.0 12/11/2015 1348   Last lithium 0.6 stable No results found for: POCLITH, LITHIUM   No results found for: PHENYTOIN, PHENOBARB, VALPROATE, CBMZ   .res Assessment: Plan:    Insomnia due to mental condition  Bipolar I disorder, most recent episode depressed ([facility])   Insomnia chronic and worsening EMA.  Failed several options.  Others: Sonata, Vistaril, Belsomra, Bz, Doxepin, Seroquel. Disc SE.  Stop trazodone.  1st hydoxyzine: 25-50 hs.  Call if fails.  2nd option doxepin 10-30 hs  Mood stable.  FU 6 mos.  Lynder Parents, MD, DFAPA   Please see After Visit Summary for patient specific instructions.  No future appointments.  No orders of the defined types were placed in this encounter.     -------------------------------

## 2018-08-07 ENCOUNTER — Other Ambulatory Visit: Payer: Self-pay | Admitting: Psychiatry

## 2018-08-07 ENCOUNTER — Telehealth: Payer: Self-pay | Admitting: Psychiatry

## 2018-08-07 MED ORDER — TRAZODONE HCL 100 MG PO TABS: 200.0000 mg | ORAL_TABLET | Freq: Every day | ORAL | 0 refills | Status: DC

## 2018-08-07 NOTE — Telephone Encounter (Signed)
Patient requesting refill for the Trazodone. Stated it was increased to 200mg  so he will need a new script per conversation to increase.Please fill at the [facility] on [address] and [address].

## 2018-08-07 NOTE — Progress Notes (Signed)
Sent in prescription patient requested trazodone 200 mg nightly.  Patient is well-informed and educated on his prescriptions.

## 2018-08-07 NOTE — Telephone Encounter (Signed)
Prescription sent in for trazodone 200 mg nightly.  Patient is well-informed on his medications and he is probably correct about the dosage change.  Have no objection.  Lynder Parents, MD

## 2018-09-08 ENCOUNTER — Other Ambulatory Visit: Payer: Self-pay | Admitting: Psychiatry

## 2018-09-09 ENCOUNTER — Other Ambulatory Visit: Payer: Self-pay

## 2018-09-09 MED ORDER — LAMOTRIGINE 200 MG PO TABS: ORAL_TABLET | ORAL | 1 refills | Status: DC

## 2018-11-01 ENCOUNTER — Other Ambulatory Visit: Payer: Self-pay | Admitting: Psychiatry

## 2018-11-16 ENCOUNTER — Other Ambulatory Visit: Payer: Self-pay

## 2018-11-16 DIAGNOSIS — H5203 Hypermetropia, bilateral: Secondary | ICD-10-CM | POA: Diagnosis not present

## 2018-11-16 DIAGNOSIS — H2513 Age-related nuclear cataract, bilateral: Secondary | ICD-10-CM | POA: Diagnosis not present

## 2018-11-16 DIAGNOSIS — H52203 Unspecified astigmatism, bilateral: Secondary | ICD-10-CM | POA: Diagnosis not present

## 2018-11-16 DIAGNOSIS — H524 Presbyopia: Secondary | ICD-10-CM | POA: Diagnosis not present

## 2018-11-16 MED ORDER — ALPRAZOLAM 0.5 MG PO TABS: 0.5000 mg | ORAL_TABLET | Freq: Two times a day (BID) | ORAL | 1 refills | Status: DC | PRN

## 2018-11-18 ENCOUNTER — Other Ambulatory Visit: Payer: Self-pay | Admitting: Psychiatry

## 2018-12-07 ENCOUNTER — Other Ambulatory Visit: Payer: Self-pay | Admitting: Psychiatry

## 2018-12-08 ENCOUNTER — Other Ambulatory Visit: Payer: Self-pay | Admitting: Psychiatry

## 2018-12-21 ENCOUNTER — Other Ambulatory Visit: Payer: Self-pay

## 2018-12-21 MED ORDER — ALPRAZOLAM 0.5 MG PO TABS: 0.5000 mg | ORAL_TABLET | Freq: Two times a day (BID) | ORAL | 1 refills | Status: DC | PRN

## 2019-01-14 ENCOUNTER — Other Ambulatory Visit: Payer: Self-pay

## 2019-01-14 ENCOUNTER — Encounter: Payer: Self-pay | Admitting: Psychiatry

## 2019-01-14 ENCOUNTER — Ambulatory Visit (INDEPENDENT_AMBULATORY_CARE_PROVIDER_SITE_OTHER): Payer: Medicare Other | Admitting: Psychiatry

## 2019-01-14 DIAGNOSIS — Z79899 Other long term (current) drug therapy: Secondary | ICD-10-CM

## 2019-01-14 DIAGNOSIS — F3130 Bipolar disorder, current episode depressed, mild or moderate severity, unspecified: Secondary | ICD-10-CM

## 2019-01-14 DIAGNOSIS — F5105 Insomnia due to other mental disorder: Secondary | ICD-10-CM | POA: Diagnosis not present

## 2019-01-14 NOTE — Progress Notes (Signed)
Drew Padilla [id number] Jun 14, 1951 68 y.o.  Subjective:   [name] is a 68 y.o. (DOB 1950/08/25) male.  Chief Complaint:  Chief Complaint  Patient presents with  . Follow-up    Medication Management  . Other    Bipolar    HPI  Drew Padilla presents to the office today for follow-up of bipolar and insomnia.  Last seen in January.  Mood was good but he was having trouble with early morning awakening.  Trazodone was switched to either doxepin or hydroxyzine.  Sleep issues resolved.  Staying up later.  No teating late helped  Other meds NR for sleep and returned to trazodone.  More consistent to bed time and up times helped.  Stress wife's health and constant caretaking.  Aggressive RA is her problem and hip replacement January without help and complications.  He's doing everything.  [place] on him emotionally.  Conflicting.    Doing OK except insomnia with EMA 4 AM after 4-5 hours.  Total 6 hours.  Gets OOB and reads.  Tried increase trazodone and it didn't help much.  No late caffeine. Tried mirtazapine without benefit. Mood is fair.  Wife's health cont to decline and a lot of health care for her.  No anxiety when awakens.  Occ Xanax.  Patient reports stable mood and denies depressed or irritable moods.  Holding up OK.  Patient denies any recent difficulty with anxiety.  Patient denies difficulty with sleep initiation or maintenance. Denies appetite disturbance.  Patient reports that energy and motivation have been good.  Patient denies any difficulty with concentration.  Patient denies any suicidal ideation.  D works all day.  Psych med history:  Ambien CR, trazodone, mirtazapine, Lunesta, doxepin, hydroxyzine, lithium, Belsomra, Seroquel, others also  Review of Systems:  Review of Systems  Neurological: Negative for tremors and weakness.  Psychiatric/Behavioral: Negative for agitation, behavioral problems, confusion, decreased concentration, dysphoric  mood, hallucinations, self-injury, sleep disturbance and suicidal ideas. The patient is not nervous/anxious and is not hyperactive.     Medications: I have reviewed the patient's current medications.  Current Outpatient Medications  Medication Sig Dispense Refill  . ALPRAZolam (XANAX) 0.5 MG tablet Take 1-2 tablets (0.5-1 mg total) by mouth 2 (two) times daily as needed for anxiety. 45 tablet 1  . doxylamine, Sleep, (UNISOM) 25 MG tablet Take 25 mg by mouth at bedtime as needed for sleep.    Marland Kitchen lamoTRIgine (LAMICTAL) 200 MG tablet Take 2 tablets   (of 200 mg for total of 400 mg) by mouth every morning and 1 tablet (200mg )every evening. (Patient taking differently: Take 200 mg by mouth 2 (two) times daily. ) 2701 tablet 1  . liothyronine (CYTOMEL) 25 MCG tablet TAKE 1 AND 1/2 TABLETS(37.5 MCG) BY MOUTH DAILY 135 tablet 0  . lithium carbonate 150 MG capsule TAKE 5 CAPSULES BY MOUTH AT NOON 450 capsule 0  . pramipexole (MIRAPEX) 0.25 MG tablet TAKE 1 TABLET(0.25 MG) BY MOUTH TWICE DAILY 180 tablet 1  . Pyridoxine HCl (VITAMIN B-6) 500 MG tablet Take 500 mg by mouth 2 (two) times a day.    . traZODone (DESYREL) 100 MG tablet TAKE 2 TABLETS(200 MG) BY MOUTH AT BEDTIME 180 tablet 0  . hydrOXYzine (ATARAX/VISTARIL) 25 MG tablet Take 1-2 tablets (25-50 mg total) by mouth at bedtime as needed. (Patient not taking: Reported on 01/14/2019) 60 tablet 1   No current facility-administered medications for this visit.     Medication Side Effects: Other:  tremor managed with B6  Allergies: No Known Allergies  Past Medical History:  Diagnosis Date  . Bipolar disorder ([place])    with primary depressive symptoms  . Chronic headaches   . Syncope 11/2015    Family History  Problem Relation Age of Onset  . Sleep apnea Father     Social History   Socioeconomic History  . Marital status: Married    Spouse name: Not on file  . Number of children: Not on file  . Years of education: Not on file  . Highest  education level: Not on file  Occupational History  . Occupation: funeral Location manager: [employer]  Social Needs  . Financial resource strain: Not on file  . Food insecurity    Worry: Not on file    Inability: Not on file  . Transportation needs    Medical: Not on file    Non-medical: Not on file  Tobacco Use  . Smoking status: Never Smoker  . Smokeless tobacco: Never Used  Substance and Sexual Activity  . Alcohol use: No  . Drug use: No  . Sexual activity: Not on file  Lifestyle  . Physical activity    Days per week: Not on file    Minutes per session: Not on file  . Stress: Not on file  Relationships  . Social Herbalist on phone: Not on file    Gets together: Not on file    Attends religious service: Not on file    Active member of club or organization: Not on file    Attends meetings of clubs or organizations: Not on file    Relationship status: Not on file  . Intimate partner violence    Fear of current or ex partner: Not on file    Emotionally abused: Not on file    Physically abused: Not on file    Forced sexual activity: Not on file  Other Topics Concern  . Not on file  Social History Narrative  . Not on file    Past Medical History, Surgical history, Social history, and Family history were reviewed and updated as appropriate.   Please see review of systems for further details on the patient's review from today.   Objective:   Physical Exam:  There were no vitals taken for this visit.  Physical Exam Constitutional:      General: He is not in acute distress.    Appearance: He is well-developed.  Musculoskeletal:        General: No deformity.  Neurological:     Mental Status: He is alert and oriented to person, place, and time.     Motor: No tremor.     Coordination: Coordination normal.     Gait: Gait normal.  Psychiatric:        Attention and Perception: Attention normal. He is attentive.        Mood and Affect: Mood  normal. Mood is not anxious or depressed. Affect is not labile, blunt, angry or inappropriate.        Speech: Speech normal.        Behavior: Behavior normal.        Thought Content: Thought content normal. Thought content does not include homicidal or suicidal ideation. Thought content does not include homicidal or suicidal plan.        Cognition and Memory: Cognition normal.        Judgment: Judgment normal.     Comments:  Insight is good.     Lab Review:     Component Value Date/Time   NA 139 12/12/2015 0702   K 4.4 12/12/2015 0702   CL 104 12/12/2015 0702   CO2 28 12/12/2015 0702   GLUCOSE 92 12/12/2015 0702   BUN 10 12/12/2015 0702   CREATININE 1.24 12/12/2015 0702   CALCIUM 9.6 12/12/2015 0702   PROT 5.6 (L) 12/12/2015 0702   ALBUMIN 3.8 12/12/2015 0702   AST 18 12/12/2015 0702   ALT 15 (L) 12/12/2015 0702   ALKPHOS 45 12/12/2015 0702   BILITOT 0.8 12/12/2015 0702   GFRNONAA 60 (L) 12/12/2015 0702   GFRAA >60 12/12/2015 0702       Component Value Date/Time   WBC 6.7 12/12/2015 0702   RBC 4.65 12/12/2015 0702   HGB 13.8 12/12/2015 0702   HCT 40.8 12/12/2015 0702   PLT 120 (L) 12/12/2015 0702   MCV 87.7 12/12/2015 0702   MCH 29.7 12/12/2015 0702   MCHC 33.8 12/12/2015 0702   RDW 12.7 12/12/2015 0702   LYMPHSABS 1.3 12/11/2015 1348   MONOABS 0.4 12/11/2015 1348   EOSABS 0.1 12/11/2015 1348   BASOSABS 0.0 12/11/2015 1348   Last lithium 0.6 stable No results found for: POCLITH, LITHIUM   No results found for: PHENYTOIN, PHENOBARB, VALPROATE, CBMZ   .res Assessment: Plan:    Powell was seen today for follow-up and other.  Diagnoses and all orders for this visit:  Bipolar I disorder, most recent episode depressed ([place])  Insomnia due to mental condition   Insomnia resolved with better hygiene.  Supportive therapy on self care and getting out for himself.  Disc his guilt over conflicted feelings.  Mood stable.  Except caretaking fatigue.  Counseled  patient regarding potential benefits, risks, and side effects of lithium to include potential risk of lithium affecting thyroid and renal function.  Discussed need for periodic lab monitoring to determine drug level and to assess for potential adverse effects.  Counseled patient regarding signs and symptoms of lithium toxicity and advised that they notify office immediately or seek urgent medical attention if experiencing these signs and symptoms.  Patient advised to contact office with any questions or concerns. Get Labs  FU 6 mos.  Lynder Parents, MD, DFAPA   Please see After Visit Summary for patient specific instructions.  No future appointments.  No orders of the defined types were placed in this encounter.     -------------------------------

## 2019-01-27 ENCOUNTER — Other Ambulatory Visit: Payer: Self-pay | Admitting: Psychiatry

## 2019-02-15 ENCOUNTER — Other Ambulatory Visit: Payer: Self-pay | Admitting: Psychiatry

## 2019-02-15 NOTE — Telephone Encounter (Signed)
Due back in Jan. Refills? Last visit 07/30

## 2019-03-04 ENCOUNTER — Other Ambulatory Visit: Payer: Self-pay | Admitting: Psychiatry

## 2019-03-08 ENCOUNTER — Other Ambulatory Visit: Payer: Self-pay | Admitting: Psychiatry

## 2019-03-08 NOTE — Telephone Encounter (Signed)
Please clarify dose of lamictal

## 2019-03-08 NOTE — Telephone Encounter (Signed)
There are 2 different sets of directions in epic about lamotrigine dosing.  Please call the patient and verify which way he is taking lamotrigine.

## 2019-03-09 NOTE — Telephone Encounter (Signed)
Spoke with pt. And he states he takes the Lamotrigine 200 mg in the am and 200 at HS.

## 2019-03-17 ENCOUNTER — Other Ambulatory Visit: Payer: Self-pay | Admitting: Psychiatry

## 2019-03-18 NOTE — Telephone Encounter (Signed)
Submitted 08/31 with 2 refills

## 2019-04-12 DIAGNOSIS — Z23 Encounter for immunization: Secondary | ICD-10-CM | POA: Diagnosis not present

## 2019-05-07 DIAGNOSIS — R509 Fever, unspecified: Secondary | ICD-10-CM | POA: Diagnosis not present

## 2019-05-07 DIAGNOSIS — R5381 Other malaise: Secondary | ICD-10-CM | POA: Diagnosis not present

## 2019-05-08 ENCOUNTER — Other Ambulatory Visit: Payer: Self-pay

## 2019-05-08 DIAGNOSIS — Z20822 Contact with and (suspected) exposure to covid-19: Secondary | ICD-10-CM

## 2019-05-10 LAB — NOVEL CORONAVIRUS, NAA: SARS-CoV-2, NAA: NOT DETECTED

## 2019-05-17 ENCOUNTER — Other Ambulatory Visit: Payer: Self-pay | Admitting: Psychiatry

## 2019-05-19 ENCOUNTER — Other Ambulatory Visit: Payer: Self-pay | Admitting: Psychiatry

## 2019-06-19 ENCOUNTER — Other Ambulatory Visit: Payer: Self-pay

## 2019-06-19 ENCOUNTER — Emergency Department (HOSPITAL_COMMUNITY): Payer: Medicare Other

## 2019-06-19 ENCOUNTER — Encounter (HOSPITAL_COMMUNITY): Payer: Self-pay

## 2019-06-19 ENCOUNTER — Emergency Department (HOSPITAL_COMMUNITY)
Admission: EM | Admit: 2019-06-19 | Discharge: 2019-06-19 | Disposition: A | Discharge status: Home / Self Care | Payer: Medicare Other | Attending: Emergency Medicine | Admitting: Emergency Medicine

## 2019-06-19 DIAGNOSIS — S93402A Sprain of unspecified ligament of left ankle, initial encounter: Secondary | ICD-10-CM | POA: Insufficient documentation

## 2019-06-19 DIAGNOSIS — Y990 Civilian activity done for income or pay: Secondary | ICD-10-CM | POA: Diagnosis not present

## 2019-06-19 DIAGNOSIS — Y9259 Other trade areas as the place of occurrence of the external cause: Secondary | ICD-10-CM | POA: Insufficient documentation

## 2019-06-19 DIAGNOSIS — Z79899 Other long term (current) drug therapy: Secondary | ICD-10-CM | POA: Diagnosis not present

## 2019-06-19 DIAGNOSIS — S99912A Unspecified injury of left ankle, initial encounter: Secondary | ICD-10-CM | POA: Diagnosis not present

## 2019-06-19 DIAGNOSIS — Y9301 Activity, walking, marching and hiking: Secondary | ICD-10-CM | POA: Diagnosis not present

## 2019-06-19 DIAGNOSIS — M7989 Other specified soft tissue disorders: Secondary | ICD-10-CM | POA: Diagnosis not present

## 2019-06-19 DIAGNOSIS — X500XXA Overexertion from strenuous movement or load, initial encounter: Secondary | ICD-10-CM | POA: Diagnosis not present

## 2019-06-19 DIAGNOSIS — M25572 Pain in left ankle and joints of left foot: Secondary | ICD-10-CM | POA: Diagnosis not present

## 2019-06-19 NOTE — ED Triage Notes (Signed)
He states he tripped and fell today, thereby injuring his left ankle, which is persistently painful and moderately swollen. He denies any other issue at this time.

## 2019-06-19 NOTE — Discharge Instructions (Addendum)
Use ice on the sore area 3-4 times a day for 30 minutes.  Use the splint as needed for problems.  See the orthopedic doctor if needed for problems.

## 2019-06-19 NOTE — ED Provider Notes (Signed)
[facility] DEPT Provider Note   CSN: [id number] Arrival date & time: 06/19/19  1810     History Chief Complaint  Patient presents with  . Ankle Pain    Drew Padilla is a 69 y.o. male.  HPI   He injured his left ankle this evening, walking down a ramp from his work shed.  He denies other injuries.  He is able ambulate using a cane.  No pre-existing injury.  There are no other known modifying factors.  Past Medical History:  Diagnosis Date  . Bipolar disorder ([place])    with primary depressive symptoms  . Chronic headaches   . Syncope 11/2015    Patient Active Problem List   Diagnosis Date Noted  . Syncope 12/11/2015  . Syncopal episodes 12/11/2015  . Bipolar disorder ([place]) 12/11/2015  . OSA (obstructive sleep apnea) 02/14/2012    Past Surgical History:  Procedure Laterality Date  . CHOLECYSTECTOMY         Family History  Problem Relation Age of Onset  . Sleep apnea Father     Social History   Tobacco Use  . Smoking status: Never Smoker  . Smokeless tobacco: Never Used  Substance Use Topics  . Alcohol use: No  . Drug use: No    Home Medications Prior to Admission medications   Medication Sig Start Date End Date Taking? Authorizing Provider  ALPRAZolam (XANAX) 0.5 MG tablet TAKE 1 TO 2 TABLETS BY MOUTH TWICE DAILY AS NEEDED FOR ANXIETY. 05/18/19   Cottle, Billey Co., MD  doxylamine, Sleep, (UNISOM) 25 MG tablet Take 25 mg by mouth at bedtime as needed for sleep.    [provider]  hydrOXYzine (ATARAX/VISTARIL) 25 MG tablet Take 1-2 tablets (25-50 mg total) by mouth at bedtime as needed. Patient not taking: Reported on 01/14/2019 07/15/18   Cottle, Billey Co., MD  lamoTRIgine (LAMICTAL) 200 MG tablet Take 1 tablet (200 mg total) by mouth 2 (two) times daily. 03/09/19   Cottle, Billey Co., MD  liothyronine (CYTOMEL) 25 MCG tablet TAKE 1 AND 1/2 TABLETS(37.5 MCG) BY MOUTH DAILY 03/08/19   Cottle, Billey Co., MD    lithium carbonate 150 MG capsule TAKE 5 CAPSULES BY MOUTH AT NOON 03/04/19   Cottle, Billey Co., MD  pramipexole (MIRAPEX) 0.25 MG tablet TAKE 1 TABLET(0.25 MG) BY MOUTH TWICE DAILY 05/20/19   Cottle, Billey Co., MD  Pyridoxine HCl (VITAMIN B-6) 500 MG tablet Take 500 mg by mouth 2 (two) times a day.    [provider]  traZODone (DESYREL) 100 MG tablet TAKE 2 TABLETS(200 MG) BY MOUTH AT BEDTIME 01/27/19   Cottle, Billey Co., MD    Allergies    Patient has no known allergies.  Review of Systems   Review of Systems  All other systems reviewed and are negative.   Physical Exam Updated Vital Signs BP 111/69 (BP Location: Left Arm)   Pulse 70   Temp 98 F (36.7 C) (Oral)   Resp 18   SpO2 96%   Physical Exam Vitals and nursing note reviewed.  Constitutional:      General: He is not in acute distress.    Appearance: He is well-developed. He is not ill-appearing, toxic-appearing or diaphoretic.  HENT:     Head: Normocephalic and atraumatic.     Right Ear: External ear normal.     Left Ear: External ear normal.  Eyes:     Conjunctiva/sclera: Conjunctivae normal.  Pupils: Pupils are equal, round, and reactive to light.  Neck:     Trachea: Phonation normal.  Cardiovascular:     Rate and Rhythm: Normal rate.  Pulmonary:     Effort: Pulmonary effort is normal.  Abdominal:     Tenderness: There is no abdominal tenderness.  Musculoskeletal:     Cervical back: Normal range of motion and neck supple.     Comments: Left ankle tenderness on both laterally and anteriorly.  Left ankle grossly stable.  Mild tenderness distal fibula.  No tenderness of the proximal or distal left lower leg.  No tenderness of the left knee.  Skin:    General: Skin is warm and dry.  Neurological:     Mental Status: He is alert and oriented to person, place, and time.     Cranial Nerves: No cranial nerve deficit.     Sensory: No sensory deficit.     Motor: No abnormal muscle tone.      Coordination: Coordination normal.  Psychiatric:        Mood and Affect: Mood normal.        Behavior: Behavior normal.        Thought Content: Thought content normal.        Judgment: Judgment normal.     ED Results / Procedures / Treatments   Labs (all labs ordered are listed, but only abnormal results are displayed) Labs Reviewed - No data to display  EKG None  Radiology DG Ankle Complete Left  Result Date: 06/19/2019 CLINICAL DATA:  Pain status post fall EXAM: LEFT ANKLE COMPLETE - 3+ VIEW COMPARISON:  None. FINDINGS: There is soft tissue swelling about the ankle. There is a subtle lucency coursing through the distal lateral malleolus. There are degenerative changes of the mortise joint. There are calcifications within the plantar fascia. There is a small plantar calcaneal spur. IMPRESSION: Findings suspicious for nondisplaced Weber type A fracture of the lateral malleolus with surrounding soft tissue swelling. Electronically Signed   By: Constance Holster M.D.   On: 06/19/2019 19:15    Procedures Procedures (including critical care time)  Medications Ordered in ED Medications - No data to display  ED Course  I have reviewed the triage vital signs and the nursing notes.  Pertinent labs & imaging results that were available during my care of the patient were reviewed by me and considered in my medical decision making (see chart for details).    MDM Rules/Calculators/A&P                       Patient Vitals for the past 24 hrs:  BP Temp Temp src Pulse Resp SpO2  06/19/19 1841 111/69 98 F (36.7 C) Oral 70 18 96 %    9:15 PM Reevaluation with update and discussion. After initial assessment and treatment, an updated evaluation reveals no change in status.  Findings discussed with the patient all questions answered. Daleen Bo   Medical Decision Making: Left ankle sprain with possible distal fibula fracture.  Left ankle stable.  Treated with splint, for comfort.  He  does not require immediate orthopedic evaluation or intervention.  CRITICAL CARE-no Performed by: Daleen Bo   Nursing Notes Reviewed/ Care Coordinated Applicable Imaging Reviewed Interpretation of Laboratory Data incorporated into ED treatment  The patient appears reasonably screened and/or stabilized for discharge and I doubt any other medical condition or other [facility] requiring further screening, evaluation, or treatment in the ED at this time prior to  discharge.  Plan: Home Medications-continue usual medicines and use ibuprofen for pain; Home Treatments-splint, ice, elevation; return here if the recommended treatment, does not improve the symptoms; Recommended follow up-orthopedic follow-up if not better in 3 to 4 weeks.   Final Clinical Impression(s) / ED Diagnoses Final diagnoses:  Sprain of left ankle, unspecified ligament, initial encounter    Rx / DC Orders ED Discharge Orders    None       Daleen Bo, MD 06/19/19 2115

## 2019-06-19 NOTE — ED Notes (Signed)
Pt verbalizes understanding of DC instructions. Pt belongings returned and is ambulatory out of ED.  

## 2019-07-01 ENCOUNTER — Other Ambulatory Visit: Payer: Self-pay

## 2019-07-01 ENCOUNTER — Other Ambulatory Visit: Payer: Self-pay | Admitting: Psychiatry

## 2019-07-01 MED ORDER — LITHIUM CARBONATE 150 MG PO CAPS: ORAL_CAPSULE | ORAL | 0 refills | Status: DC

## 2019-07-01 MED ORDER — LAMOTRIGINE 200 MG PO TABS: 200.0000 mg | ORAL_TABLET | Freq: Two times a day (BID) | ORAL | 0 refills | Status: DC

## 2019-07-01 MED ORDER — PROPRANOLOL HCL 20 MG PO TABS: 20.0000 mg | ORAL_TABLET | Freq: Two times a day (BID) | ORAL | 1 refills | Status: DC | PRN

## 2019-07-01 NOTE — Telephone Encounter (Signed)
Received refill requests from [facility], new pharmacy for patient.  submitted Lamictal 200 mg and Lithium 150 mg capsule per request

## 2019-07-16 ENCOUNTER — Other Ambulatory Visit: Payer: Self-pay

## 2019-07-16 ENCOUNTER — Ambulatory Visit (INDEPENDENT_AMBULATORY_CARE_PROVIDER_SITE_OTHER): Payer: Medicare Other | Admitting: Psychiatry

## 2019-07-16 ENCOUNTER — Encounter: Payer: Self-pay | Admitting: Psychiatry

## 2019-07-16 DIAGNOSIS — F3130 Bipolar disorder, current episode depressed, mild or moderate severity, unspecified: Secondary | ICD-10-CM

## 2019-07-16 DIAGNOSIS — F5105 Insomnia due to other mental disorder: Secondary | ICD-10-CM

## 2019-07-16 DIAGNOSIS — Z79899 Other long term (current) drug therapy: Secondary | ICD-10-CM | POA: Diagnosis not present

## 2019-07-16 NOTE — Progress Notes (Signed)
ROPER HOEG [id number] 08/24/1950 69 y.o.  Subjective:   [name] is a 69 y.o. (DOB August 29, 1950) male.  Chief Complaint:  Chief Complaint  Patient presents with  . Follow-up  . Depression  . Sleeping Problem    HPI  Drew Padilla presents to the office today for follow-up of bipolar and insomnia.  seen in January.  Mood was good but he was having trouble with early morning awakening.  Trazodone was switched to either doxepin or hydroxyzine. Sleep issues resolved.   returned to trazodone.  More consistent to bed time and up times helped.  Last seen July 2020.  He was doing fairly well and no meds were changed.  Best I can.   Stress wife's health and constant caretaking.  Taking more and more out of hime and feels sort of guilty about it.  Last Sat fell and fx shoulder and surgery so doing everything for her.  Aggressive RA is her problem and hip replacement January 2020 without help and complications.  He's doing everything.  [place] on him emotionally.  Not like retirement expected.  Staying in.  Xanax a couple times of week.  Propranolol prn tremor.  Doing OK except insomnia chronic he thinks related to listening out for wife.  Not drowsy.  Total 6 hours.  Gets OOB and reads.  Tried increase trazodone and it didn't help much.  No late caffeine. Tried mirtazapine without benefit. Mood is fair.  Wife's health cont to decline and a lot of health care for her.  No anxiety when awakens.  Occ Xanax.  Patient reports stable mood and denies depressed or irritable moods.  Holding up OK.  Patient denies any recent difficulty with anxiety.  Patient denies difficulty with sleep initiation or maintenance. Denies appetite disturbance.  Patient reports that energy and motivation have been good.  Patient denies any difficulty with concentration.  Patient denies any suicidal ideation.  D works all day.  Psych med history:  Ambien CR, trazodone, mirtazapine, Lunesta,  doxepin, hydroxyzine, unisom, Belsomra, Seroquel, Lamotrigine, Cytomel lithium,  Pramipexole,  others also  Review of Systems:  Review of Systems  Neurological: Negative for tremors and weakness.  Psychiatric/Behavioral: Negative for agitation, behavioral problems, confusion, decreased concentration, dysphoric mood, hallucinations, self-injury, sleep disturbance and suicidal ideas. The patient is not nervous/anxious and is not hyperactive.     Medications: I have reviewed the patient's current medications.  Current Outpatient Medications  Medication Sig Dispense Refill  . ALPRAZolam (XANAX) 0.5 MG tablet TAKE 1 TO 2 TABLETS BY MOUTH TWICE DAILY AS NEEDED FOR ANXIETY. 45 tablet 2  . lamoTRIgine (LAMICTAL) 200 MG tablet Take 1 tablet (200 mg total) by mouth 2 (two) times daily. 180 tablet 0  . liothyronine (CYTOMEL) 25 MCG tablet TAKE 1 AND 1/2 TABLETS(37.5 MCG) BY MOUTH DAILY 135 tablet 1  . lithium carbonate 150 MG capsule TAKE 5 CAPSULES BY MOUTH AT NOON 450 capsule 0  . pramipexole (MIRAPEX) 0.25 MG tablet TAKE 1 TABLET(0.25 MG) BY MOUTH TWICE DAILY 180 tablet 1  . propranolol (INDERAL) 20 MG tablet Take 1-2 tablets (20-40 mg total) by mouth 2 (two) times daily as needed. 120 tablet 1  . Pyridoxine HCl (VITAMIN B-6) 500 MG tablet Take 500 mg by mouth 2 (two) times a day.    . traZODone (DESYREL) 100 MG tablet TAKE 2 TABLETS(200 MG) BY MOUTH AT BEDTIME 180 tablet 1  . doxylamine, Sleep, (UNISOM) 25 MG tablet Take 25 mg by  mouth at bedtime as needed for sleep.    . hydrOXYzine (ATARAX/VISTARIL) 25 MG tablet Take 1-2 tablets (25-50 mg total) by mouth at bedtime as needed. (Patient not taking: Reported on 01/14/2019) 60 tablet 1   No current facility-administered medications for this visit.    Medication Side Effects: Other: tremor managed with B6  Allergies: No Known Allergies  Past Medical History:  Diagnosis Date  . Bipolar disorder ([place])    with primary depressive symptoms  .  Chronic headaches   . Syncope 11/2015    Family History  Problem Relation Age of Onset  . Sleep apnea Father     Social History   Socioeconomic History  . Marital status: Married    Spouse name: Not on file  . Number of children: Not on file  . Years of education: Not on file  . Highest education level: Not on file  Occupational History  . Occupation: funeral service    Employer: [employer]  Tobacco Use  . Smoking status: Never Smoker  . Smokeless tobacco: Never Used  Substance and Sexual Activity  . Alcohol use: No  . Drug use: No  . Sexual activity: Not on file  Other Topics Concern  . Not on file  Social History Narrative  . Not on file   Social Determinants of Health   Financial Resource Strain:   . Difficulty of Paying Living Expenses: Not on file  Food Insecurity:   . Worried About Charity fundraiser in the Last Year: Not on file  . Ran Out of Food in the Last Year: Not on file  Transportation Needs:   . Lack of Transportation (Medical): Not on file  . Lack of Transportation (Non-Medical): Not on file  Physical Activity:   . Days of Exercise per Week: Not on file  . Minutes of Exercise per Session: Not on file  Stress:   . Feeling of Stress : Not on file  Social Connections:   . Frequency of Communication with Friends and Family: Not on file  . Frequency of Social Gatherings with Friends and Family: Not on file  . Attends Religious Services: Not on file  . Active Member of Clubs or Organizations: Not on file  . Attends Archivist Meetings: Not on file  . Marital Status: Not on file  Intimate Partner Violence:   . Fear of Current or Ex-Partner: Not on file  . Emotionally Abused: Not on file  . Physically Abused: Not on file  . Sexually Abused: Not on file    Past Medical History, Surgical history, Social history, and Family history were reviewed and updated as appropriate.   Please see review of systems for further details on the  patient's review from today.   Objective:   Physical Exam:  There were no vitals taken for this visit.  Physical Exam Constitutional:      General: He is not in acute distress.    Appearance: He is well-developed.  Musculoskeletal:        General: No deformity.  Neurological:     Mental Status: He is alert and oriented to person, place, and time.     Motor: No tremor.     Coordination: Coordination normal.     Gait: Gait normal.  Psychiatric:        Attention and Perception: Attention normal. He is attentive.        Mood and Affect: Mood normal. Mood is not anxious or depressed. Affect is  not labile, blunt, angry or inappropriate.        Speech: Speech normal.        Behavior: Behavior normal.        Thought Content: Thought content normal. Thought content does not include homicidal or suicidal ideation. Thought content does not include homicidal or suicidal plan.        Cognition and Memory: Cognition normal.        Judgment: Judgment normal.     Comments: Insight is good.     Lab Review:     Component Value Date/Time   NA 139 12/12/2015 0702   K 4.4 12/12/2015 0702   CL 104 12/12/2015 0702   CO2 28 12/12/2015 0702   GLUCOSE 92 12/12/2015 0702   BUN 10 12/12/2015 0702   CREATININE 1.24 12/12/2015 0702   CALCIUM 9.6 12/12/2015 0702   PROT 5.6 (L) 12/12/2015 0702   ALBUMIN 3.8 12/12/2015 0702   AST 18 12/12/2015 0702   ALT 15 (L) 12/12/2015 0702   ALKPHOS 45 12/12/2015 0702   BILITOT 0.8 12/12/2015 0702   GFRNONAA 60 (L) 12/12/2015 0702   GFRAA >60 12/12/2015 0702       Component Value Date/Time   WBC 6.7 12/12/2015 0702   RBC 4.65 12/12/2015 0702   HGB 13.8 12/12/2015 0702   HCT 40.8 12/12/2015 0702   PLT 120 (L) 12/12/2015 0702   MCV 87.7 12/12/2015 0702   MCH 29.7 12/12/2015 0702   MCHC 33.8 12/12/2015 0702   RDW 12.7 12/12/2015 0702   LYMPHSABS 1.3 12/11/2015 1348   MONOABS 0.4 12/11/2015 1348   EOSABS 0.1 12/11/2015 1348   BASOSABS 0.0 12/11/2015  1348   Last lithium 0.6 stable No results found for: POCLITH, LITHIUM   No results found for: PHENYTOIN, PHENOBARB, VALPROATE, CBMZ   .res Assessment: Plan:    Nicholes was seen today for follow-up, depression and sleeping problem.  Diagnoses and all orders for this visit:  Bipolar I disorder, most recent episode depressed ([place]) -     Basic metabolic panel -     Lithium level -     TSH  Insomnia due to mental condition  Lithium use -     Basic metabolic panel -     Lithium level -     TSH   Insomnia resolved with better hygiene.  Supportive therapy on self care and getting out for himself.  Disc his guilt over conflicted feelings.  Mood stable.  Except caretaking fatigue.  Counseled patient regarding potential benefits, risks, and side effects of lithium to include potential risk of lithium affecting thyroid and renal function.  Discussed need for periodic lab monitoring to determine drug level and to assess for potential adverse effects.  Counseled patient regarding signs and symptoms of lithium toxicity and advised that they notify office immediately or seek urgent medical attention if experiencing these signs and symptoms.  Patient advised to contact office with any questions or concerns. Emphasized need for labs.  [facility]  No med change needed  FU 6 mos.  Lynder Parents, MD, DFAPA   Please see After Visit Summary for patient specific instructions.  No future appointments.  Orders Placed This Encounter  Procedures  . Basic metabolic panel  . Lithium level  . TSH      -------------------------------

## 2019-07-16 NOTE — Patient Instructions (Signed)
Get lab tests in the morning

## 2019-07-25 ENCOUNTER — Other Ambulatory Visit: Payer: Self-pay | Admitting: Psychiatry

## 2019-07-30 ENCOUNTER — Other Ambulatory Visit: Payer: Self-pay | Admitting: Psychiatry

## 2019-07-30 DIAGNOSIS — Z23 Encounter for immunization: Secondary | ICD-10-CM | POA: Diagnosis not present

## 2019-08-01 NOTE — Telephone Encounter (Signed)
Next apt 12/2019

## 2019-08-02 ENCOUNTER — Other Ambulatory Visit: Payer: Self-pay

## 2019-08-02 MED ORDER — TRAZODONE HCL 100 MG PO TABS: ORAL_TABLET | ORAL | 1 refills | Status: DC

## 2019-08-15 ENCOUNTER — Other Ambulatory Visit: Payer: Self-pay | Admitting: Psychiatry

## 2019-08-24 ENCOUNTER — Other Ambulatory Visit: Payer: Self-pay | Admitting: Psychiatry

## 2019-08-27 DIAGNOSIS — Z23 Encounter for immunization: Secondary | ICD-10-CM | POA: Diagnosis not present

## 2019-09-23 ENCOUNTER — Other Ambulatory Visit: Payer: Self-pay | Admitting: Psychiatry

## 2019-11-01 ENCOUNTER — Other Ambulatory Visit: Payer: Self-pay

## 2019-11-01 MED ORDER — TRAZODONE HCL 100 MG PO TABS: ORAL_TABLET | ORAL | 1 refills | Status: DC

## 2019-11-03 ENCOUNTER — Other Ambulatory Visit: Payer: Self-pay

## 2019-11-03 MED ORDER — PRAMIPEXOLE DIHYDROCHLORIDE 0.25 MG PO TABS: ORAL_TABLET | ORAL | 0 refills | Status: DC

## 2019-11-13 ENCOUNTER — Other Ambulatory Visit: Payer: Self-pay | Admitting: Psychiatry

## 2019-11-16 DIAGNOSIS — H25813 Combined forms of age-related cataract, bilateral: Secondary | ICD-10-CM | POA: Diagnosis not present

## 2019-11-17 DIAGNOSIS — G4733 Obstructive sleep apnea (adult) (pediatric): Secondary | ICD-10-CM | POA: Diagnosis not present

## 2019-11-17 DIAGNOSIS — Z131 Encounter for screening for diabetes mellitus: Secondary | ICD-10-CM | POA: Diagnosis not present

## 2019-11-17 DIAGNOSIS — Z0000 Encounter for general adult medical examination without abnormal findings: Secondary | ICD-10-CM | POA: Diagnosis not present

## 2019-11-17 DIAGNOSIS — Z125 Encounter for screening for malignant neoplasm of prostate: Secondary | ICD-10-CM | POA: Diagnosis not present

## 2019-11-17 DIAGNOSIS — Z86010 Personal history of colonic polyps: Secondary | ICD-10-CM | POA: Diagnosis not present

## 2019-11-17 DIAGNOSIS — Z85820 Personal history of malignant melanoma of skin: Secondary | ICD-10-CM | POA: Diagnosis not present

## 2019-11-17 DIAGNOSIS — Z23 Encounter for immunization: Secondary | ICD-10-CM | POA: Diagnosis not present

## 2019-11-17 DIAGNOSIS — Z13220 Encounter for screening for lipoid disorders: Secondary | ICD-10-CM | POA: Diagnosis not present

## 2019-11-17 DIAGNOSIS — F319 Bipolar disorder, unspecified: Secondary | ICD-10-CM | POA: Diagnosis not present

## 2019-12-23 DIAGNOSIS — R109 Unspecified abdominal pain: Secondary | ICD-10-CM | POA: Diagnosis not present

## 2019-12-23 DIAGNOSIS — F3130 Bipolar disorder, current episode depressed, mild or moderate severity, unspecified: Secondary | ICD-10-CM | POA: Diagnosis not present

## 2019-12-23 DIAGNOSIS — Z1159 Encounter for screening for other viral diseases: Secondary | ICD-10-CM | POA: Diagnosis not present

## 2019-12-23 DIAGNOSIS — Z79899 Other long term (current) drug therapy: Secondary | ICD-10-CM | POA: Diagnosis not present

## 2019-12-23 DIAGNOSIS — Z86010 Personal history of colonic polyps: Secondary | ICD-10-CM | POA: Diagnosis not present

## 2019-12-24 LAB — BASIC METABOLIC PANEL
BUN: 14 mg/dL (ref 7–25)
CO2: 25 mmol/L (ref 20–32)
Calcium: 10.1 mg/dL (ref 8.6–10.3)
Chloride: 104 mmol/L (ref 98–110)
Creat: 1.13 mg/dL (ref 0.70–1.25)
Glucose, Bld: 84 mg/dL (ref 65–99)
Potassium: 4.2 mmol/L (ref 3.5–5.3)
Sodium: 138 mmol/L (ref 135–146)

## 2019-12-24 LAB — LITHIUM LEVEL: Lithium Lvl: 0.5 mmol/L — ABNORMAL LOW (ref 0.6–1.2)

## 2019-12-24 LAB — TSH: TSH: 1.43 mIU/L (ref 0.40–4.50)

## 2019-12-25 NOTE — Progress Notes (Signed)
Lithium level 0.5 is usual low dose. With normal BMP and TSH

## 2019-12-27 ENCOUNTER — Other Ambulatory Visit: Payer: Self-pay | Admitting: Psychiatry

## 2020-01-14 ENCOUNTER — Encounter: Payer: Self-pay | Admitting: Psychiatry

## 2020-01-14 ENCOUNTER — Other Ambulatory Visit: Payer: Self-pay

## 2020-01-14 ENCOUNTER — Ambulatory Visit (INDEPENDENT_AMBULATORY_CARE_PROVIDER_SITE_OTHER): Payer: Medicare Other | Admitting: Psychiatry

## 2020-01-14 DIAGNOSIS — F411 Generalized anxiety disorder: Secondary | ICD-10-CM

## 2020-01-14 DIAGNOSIS — F5105 Insomnia due to other mental disorder: Secondary | ICD-10-CM

## 2020-01-14 DIAGNOSIS — Z79899 Other long term (current) drug therapy: Secondary | ICD-10-CM

## 2020-01-14 DIAGNOSIS — F3130 Bipolar disorder, current episode depressed, mild or moderate severity, unspecified: Secondary | ICD-10-CM

## 2020-01-14 MED ORDER — ALPRAZOLAM 0.5 MG PO TABS: 0.5000 mg | ORAL_TABLET | Freq: Two times a day (BID) | ORAL | 4 refills | Status: DC | PRN

## 2020-01-14 MED ORDER — TRAZODONE HCL 100 MG PO TABS: ORAL_TABLET | ORAL | 1 refills | Status: DC

## 2020-01-14 MED ORDER — LIOTHYRONINE SODIUM 25 MCG PO TABS: ORAL_TABLET | ORAL | 1 refills | Status: DC

## 2020-01-14 MED ORDER — LAMOTRIGINE 200 MG PO TABS: 200.0000 mg | ORAL_TABLET | Freq: Two times a day (BID) | ORAL | 1 refills | Status: DC

## 2020-01-14 MED ORDER — PRAMIPEXOLE DIHYDROCHLORIDE 0.25 MG PO TABS: ORAL_TABLET | ORAL | 0 refills | Status: DC

## 2020-01-14 MED ORDER — LITHIUM CARBONATE 150 MG PO CAPS: ORAL_CAPSULE | ORAL | 1 refills | Status: DC

## 2020-01-14 NOTE — Progress Notes (Signed)
Drew Padilla [id number] April 02, 1951 69 y.o.  Subjective:   [name] is a 69 y.o. (DOB 1950-10-08) male.  Chief Complaint:  Chief Complaint  Patient presents with  . Follow-up    HPI  Drew Padilla presents to the office today for follow-up of bipolar and insomnia.  seen in January.  Good physical.  No otgher meds except Rx here..  Best I can.   Stress wife's health and constant caretaking.  Taking more and more out of hime and feels sort of guilty about it.    Aggressive RA is her problem and hip replacement January 2020 without help and complications.  He's doing everything.  Waring on him emotionally.  Not like retirement expected.  Staying in.  W gradual deterioration.  Xanax a couple times of week.  Propranolol prn tremor. Minimal tremor.  Sleep good.  Patient reports stable mood and denies depressed or irritable moods. occ short term dip.   Holding up OK.  Patient denies any recent difficulty with anxiety.  Patient denies difficulty with sleep initiation or maintenance. Denies appetite disturbance.  Patient reports that energy and motivation have been good.  Patient denies any difficulty with concentration.  Patient denies any suicidal ideation.  D works all day.  Psych med history:  Ambien CR, trazodone, mirtazapine NR, Lunesta, doxepin, hydroxyzine, unisom, Belsomra, Seroquel, Lamotrigine, Cytomel lithium,  Pramipexole,  others also  Review of Systems:  Review of Systems  Cardiovascular: Negative for palpitations.  Endocrine: Negative for polyuria.  Neurological: Negative for tremors and weakness.  Psychiatric/Behavioral: Negative for agitation, behavioral problems, confusion, decreased concentration, dysphoric mood, hallucinations, self-injury, sleep disturbance and suicidal ideas. The patient is not nervous/anxious and is not hyperactive.     Medications: I have reviewed the patient's current medications.  Current Outpatient Medications   Medication Sig Dispense Refill  . ALPRAZolam (XANAX) 0.5 MG tablet TAKE 1-2 TABLET BY MOUTH TWICE A DAY AS NEEDED FOR ANXIETY. 45 tablet 1  . doxylamine, Sleep, (UNISOM) 25 MG tablet Take 25 mg by mouth at bedtime as needed for sleep.    . hydrOXYzine (ATARAX/VISTARIL) 25 MG tablet Take 1-2 tablets (25-50 mg total) by mouth at bedtime as needed. 60 tablet 1  . lamoTRIgine (LAMICTAL) 200 MG tablet TAKE 1 TABLET BY MOUTH TWICE A DAY 180 tablet 1  . liothyronine (CYTOMEL) 25 MCG tablet TAKE 1 AND 1/2 TABLETS(37.5 MCG) BY MOUTH DAILY 135 tablet 1  . lithium carbonate 150 MG capsule TAKE 5 CAPSULES BY MOUTH AT NOON 450 capsule 1  . pramipexole (MIRAPEX) 0.25 MG tablet TAKE 1 TABLET BY MOUTH TWICE A DAY. 180 tablet 0  . propranolol (INDERAL) 20 MG tablet TAKE 1-2 TABLETS (20-40 MG TOTAL) BY MOUTH 2 (TWO) TIMES DAILY AS NEEDED. 120 tablet 1  . Pyridoxine HCl (VITAMIN B-6) 500 MG tablet Take 500 mg by mouth 2 (two) times a day.    . traZODone (DESYREL) 100 MG tablet Take 2 tablets (200 mg) by mouth at bedtime 180 tablet 1   No current facility-administered medications for this visit.    Medication Side Effects: Other: tremor managed with B6  Allergies: No Known Allergies  Past Medical History:  Diagnosis Date  . Bipolar disorder ([place])    with primary depressive symptoms  . Chronic headaches   . Syncope 11/2015    Family History  Problem Relation Age of Onset  . Sleep apnea Father     Social History   Socioeconomic History  .  Marital status: Married    Spouse name: Not on file  . Number of children: Not on file  . Years of education: Not on file  . Highest education level: Not on file  Occupational History  . Occupation: funeral service    Employer: [employer]  Tobacco Use  . Smoking status: Never Smoker  . Smokeless tobacco: Never Used  Substance and Sexual Activity  . Alcohol use: No  . Drug use: No  . Sexual activity: Not on file  Other Topics Concern  . Not on file   Social History Narrative  . Not on file   Social Determinants of Health   Financial Resource Strain:   . Difficulty of Paying Living Expenses:   Food Insecurity:   . Worried About Charity fundraiser in the Last Year:   . Arboriculturist in the Last Year:   Transportation Needs:   . Film/video editor (Medical):   Marland Kitchen Lack of Transportation (Non-Medical):   Physical Activity:   . Days of Exercise per Week:   . Minutes of Exercise per Session:   Stress:   . Feeling of Stress :   Social Connections:   . Frequency of Communication with Friends and Family:   . Frequency of Social Gatherings with Friends and Family:   . Attends Religious Services:   . Active Member of Clubs or Organizations:   . Attends Archivist Meetings:   Marland Kitchen Marital Status:   Intimate Partner Violence:   . Fear of Current or Ex-Partner:   . Emotionally Abused:   Marland Kitchen Physically Abused:   . Sexually Abused:     Past Medical History, Surgical history, Social history, and Family history were reviewed and updated as appropriate.   Please see review of systems for further details on the patient's review from today.   Objective:   Physical Exam:  There were no vitals taken for this visit.  Physical Exam Constitutional:      General: He is not in acute distress.    Appearance: He is well-developed.  Musculoskeletal:        General: No deformity.  Neurological:     Mental Status: He is alert and oriented to person, place, and time.     Motor: No tremor.     Coordination: Coordination normal.     Gait: Gait normal.  Psychiatric:        Attention and Perception: Attention normal. He is attentive.        Mood and Affect: Mood normal. Mood is not anxious or depressed. Affect is not labile, blunt, angry, tearful or inappropriate.        Speech: Speech normal.        Behavior: Behavior normal.        Thought Content: Thought content normal. Thought content does not include homicidal or suicidal  ideation. Thought content does not include homicidal or suicidal plan.        Cognition and Memory: Cognition normal.        Judgment: Judgment normal.     Comments: Insight is good.     Lab Review:     Component Value Date/Time   NA 138 12/23/2019 1022   K 4.2 12/23/2019 1022   CL 104 12/23/2019 1022   CO2 25 12/23/2019 1022   GLUCOSE 84 12/23/2019 1022   BUN 14 12/23/2019 1022   CREATININE 1.13 12/23/2019 1022   CALCIUM 10.1 12/23/2019 1022   PROT 5.6 (L) 12/12/2015  8675   ALBUMIN 3.8 12/12/2015 0702   AST 18 12/12/2015 0702   ALT 15 (L) 12/12/2015 0702   ALKPHOS 45 12/12/2015 0702   BILITOT 0.8 12/12/2015 0702   GFRNONAA 60 (L) 12/12/2015 0702   GFRAA >60 12/12/2015 0702       Component Value Date/Time   WBC 6.7 12/12/2015 0702   RBC 4.65 12/12/2015 0702   HGB 13.8 12/12/2015 0702   HCT 40.8 12/12/2015 0702   PLT 120 (L) 12/12/2015 0702   MCV 87.7 12/12/2015 0702   MCH 29.7 12/12/2015 0702   MCHC 33.8 12/12/2015 0702   RDW 12.7 12/12/2015 0702   LYMPHSABS 1.3 12/11/2015 1348   MONOABS 0.4 12/11/2015 1348   EOSABS 0.1 12/11/2015 1348   BASOSABS 0.0 12/11/2015 1348   Last lithium 0.6 stable Lithium Lvl  Date Value Ref Range Status  12/23/2019 0.5 (L) 0.6 - 1.2 mmol/L Final     No results found for: PHENYTOIN, PHENOBARB, VALPROATE, CBMZ   .res Assessment: Plan:    Lonald was seen today for follow-up.  Diagnoses and all orders for this visit:  Bipolar I disorder, most recent episode depressed ([place])  Insomnia due to mental condition  Lithium use   Insomnia resolved with better hygiene.  Supportive therapy on importance self care and getting out for himself.  Disc his guilt over conflicted feelings. Disc longterm plans  Mood stable.  Except caretaking fatigue.  Counseled patient regarding potential benefits, risks, and side effects of lithium to include potential risk of lithium affecting thyroid and renal function.  Discussed need for periodic  lab monitoring to determine drug level and to assess for potential adverse effects.  Counseled patient regarding signs and symptoms of lithium toxicity and advised that they notify office immediately or seek urgent medical attention if experiencing these signs and symptoms.  Patient advised to contact office with any questions or concerns. Emphasized need for labs.  [facility]  No med change needed  FU 6 mos.  Lynder Parents, MD, DFAPA   Please see After Visit Summary for patient specific instructions.  No future appointments.  No orders of the defined types were placed in this encounter.     -------------------------------

## 2020-04-19 DIAGNOSIS — Z23 Encounter for immunization: Secondary | ICD-10-CM | POA: Diagnosis not present

## 2020-06-01 DIAGNOSIS — Z23 Encounter for immunization: Secondary | ICD-10-CM | POA: Diagnosis not present

## 2020-07-11 ENCOUNTER — Other Ambulatory Visit: Payer: Self-pay | Admitting: Psychiatry

## 2020-07-11 ENCOUNTER — Ambulatory Visit: Payer: Medicare Other | Admitting: Psychiatry

## 2020-07-11 DIAGNOSIS — F3130 Bipolar disorder, current episode depressed, mild or moderate severity, unspecified: Secondary | ICD-10-CM

## 2020-07-11 NOTE — Progress Notes (Incomplete)
Drew Padilla [id number] 06-09-1951 70 y.o.  Subjective:   [name] is a 70 y.o. (DOB [birth year]) male.  Chief Complaint:  No chief complaint on file.   HPI  Drew Padilla presents to the office today for follow-up of bipolar and insomnia.  seen in January 2020.  Good physical.  No otgher meds except Rx here..  01/14/2020 appointment with the following noted: Best I can.   Stress wife's health and constant caretaking.  Taking more and more out of hime and feels sort of guilty about it.    Aggressive RA is her problem and hip replacement January 2020 without help and complications.  He's doing everything.  Waring on him emotionally.  Not like retirement expected.  Staying in.  W gradual deterioration. Xanax a couple times of week.  Propranolol prn tremor. Minimal tremor. Sleep good. Plan: Continue current meds and get labs for lithium as soon as possible.  07/11/2020 appointment with following noted:   Patient reports stable mood and denies depressed or irritable moods. occ short term dip.   Holding up OK.  Patient denies any recent difficulty with anxiety.  Patient denies difficulty with sleep initiation or maintenance. Denies appetite disturbance.  Patient reports that energy and motivation have been good.  Patient denies any difficulty with concentration.  Patient denies any suicidal ideation.  D works all day.  Psych med history:  Ambien CR, trazodone, mirtazapine NR, Lunesta, doxepin, hydroxyzine, unisom, Belsomra, Seroquel, Lamotrigine, Cytomel lithium,  Pramipexole,  others also  Review of Systems:  Review of Systems  Cardiovascular: Negative for palpitations.  Endocrine: Negative for polyuria.  Neurological: Negative for tremors and weakness.  Psychiatric/Behavioral: Negative for agitation, behavioral problems, confusion, decreased concentration, dysphoric mood, hallucinations, self-injury, sleep disturbance and suicidal ideas. The patient is not  nervous/anxious and is not hyperactive.     Medications: I have reviewed the patient's current medications.  Current Outpatient Medications  Medication Sig Dispense Refill  . ALPRAZolam (XANAX) 0.5 MG tablet Take 1 tablet (0.5 mg total) by mouth 2 (two) times daily as needed for anxiety. 45 tablet 4  . doxylamine, Sleep, (UNISOM) 25 MG tablet Take 25 mg by mouth at bedtime as needed for sleep.    Marland Kitchen lamoTRIgine (LAMICTAL) 200 MG tablet Take 1 tablet (200 mg total) by mouth 2 (two) times daily. 180 tablet 1  . liothyronine (CYTOMEL) 25 MCG tablet TAKE 1 AND 1/2 TABLETS(37.5 MCG) BY MOUTH DAILY 135 tablet 1  . lithium carbonate 150 MG capsule 5 capsules daily 450 capsule 1  . pramipexole (MIRAPEX) 0.25 MG tablet TAKE 1 TABLET BY MOUTH TWICE A DAY. 180 tablet 0  . propranolol (INDERAL) 20 MG tablet TAKE 1-2 TABLETS (20-40 MG TOTAL) BY MOUTH 2 (TWO) TIMES DAILY AS NEEDED. 120 tablet 1  . Pyridoxine HCl (VITAMIN B-6) 500 MG tablet Take 500 mg by mouth 2 (two) times a day.    . traZODone (DESYREL) 100 MG tablet Take 2 tablets (200 mg) by mouth at bedtime 180 tablet 1   No current facility-administered medications for this visit.    Medication Side Effects: Other: tremor managed with B6  Allergies: No Known Allergies  Past Medical History:  Diagnosis Date  . Bipolar disorder ([place])    with primary depressive symptoms  . Chronic headaches   . Syncope 11/2015    Family History  Problem Relation Age of Onset  . Sleep apnea Father     Social History   Socioeconomic History  .  Marital status: Married    Spouse name: Not on file  . Number of children: Not on file  . Years of education: Not on file  . Highest education level: Not on file  Occupational History  . Occupation: funeral service    Employer: [employer]  Tobacco Use  . Smoking status: Never Smoker  . Smokeless tobacco: Never Used  Substance and Sexual Activity  . Alcohol use: No  . Drug use: No  . Sexual activity:  Not on file  Other Topics Concern  . Not on file  Social History Narrative  . Not on file   Social Determinants of Health   Financial Resource Strain: Not on file  Food Insecurity: Not on file  Transportation Needs: Not on file  Physical Activity: Not on file  Stress: Not on file  Social Connections: Not on file  Intimate Partner Violence: Not on file    Past Medical History, Surgical history, Social history, and Family history were reviewed and updated as appropriate.   Please see review of systems for further details on the patient's review from today.   Objective:   Physical Exam:  There were no vitals taken for this visit.  Physical Exam Constitutional:      General: He is not in acute distress.    Appearance: He is well-developed.  Musculoskeletal:        General: No deformity.  Neurological:     Mental Status: He is alert and oriented to person, place, and time.     Motor: No tremor.     Coordination: Coordination normal.     Gait: Gait normal.  Psychiatric:        Attention and Perception: Attention normal. He is attentive.        Mood and Affect: Mood normal. Mood is not anxious or depressed. Affect is not labile, blunt, angry, tearful or inappropriate.        Speech: Speech normal.        Behavior: Behavior normal.        Thought Content: Thought content normal. Thought content does not include homicidal or suicidal ideation. Thought content does not include homicidal or suicidal plan.        Cognition and Memory: Cognition normal.        Judgment: Judgment normal.     Comments: Insight is good.     Lab Review:     Component Value Date/Time   NA 138 12/23/2019 1022   K 4.2 12/23/2019 1022   CL 104 12/23/2019 1022   CO2 25 12/23/2019 1022   GLUCOSE 84 12/23/2019 1022   BUN 14 12/23/2019 1022   CREATININE 1.13 12/23/2019 1022   CALCIUM 10.1 12/23/2019 1022   PROT 5.6 (L) 12/12/2015 0702   ALBUMIN 3.8 12/12/2015 0702   AST 18 12/12/2015 0702   ALT  15 (L) 12/12/2015 0702   ALKPHOS 45 12/12/2015 0702   BILITOT 0.8 12/12/2015 0702   GFRNONAA 60 (L) 12/12/2015 0702   GFRAA >60 12/12/2015 0702       Component Value Date/Time   WBC 6.7 12/12/2015 0702   RBC 4.65 12/12/2015 0702   HGB 13.8 12/12/2015 0702   HCT 40.8 12/12/2015 0702   PLT 120 (L) 12/12/2015 0702   MCV 87.7 12/12/2015 0702   MCH 29.7 12/12/2015 0702   MCHC 33.8 12/12/2015 0702   RDW 12.7 12/12/2015 0702   LYMPHSABS 1.3 12/11/2015 1348   MONOABS 0.4 12/11/2015 1348   EOSABS 0.1 12/11/2015 1348  BASOSABS 0.0 12/11/2015 1348   Last lithium 0.6 stable Lithium Lvl  Date Value Ref Range Status  12/23/2019 0.5 (L) 0.6 - 1.2 mmol/L Final     No results found for: PHENYTOIN, PHENOBARB, VALPROATE, CBMZ   .res Assessment: Plan:    There are no diagnoses linked to this encounter. Insomnia resolved with better hygiene.  Supportive therapy on importance self care and getting out for himself.  Disc his guilt over conflicted feelings. Disc longterm plans  Mood stable.  Except caretaking fatigue.  Counseled patient regarding potential benefits, risks, and side effects of lithium to include potential risk of lithium affecting thyroid and renal function.  Discussed need for periodic lab monitoring to determine drug level and to assess for potential adverse effects.  Counseled patient regarding signs and symptoms of lithium toxicity and advised that they notify office immediately or seek urgent medical attention if experiencing these signs and symptoms.  Patient advised to contact office with any questions or concerns. Emphasized need for labs.  [facility]  No med change needed  FU 6 mos.  Lynder Parents, MD, DFAPA   Please see After Visit Summary for patient specific instructions.  No future appointments.  No orders of the defined types were placed in this encounter.     -------------------------------

## 2020-07-30 ENCOUNTER — Other Ambulatory Visit: Payer: Self-pay | Admitting: Psychiatry

## 2020-07-30 DIAGNOSIS — F3130 Bipolar disorder, current episode depressed, mild or moderate severity, unspecified: Secondary | ICD-10-CM

## 2020-08-31 ENCOUNTER — Ambulatory Visit: Payer: Medicare Other | Admitting: Psychiatry

## 2020-09-07 ENCOUNTER — Other Ambulatory Visit: Payer: Self-pay | Admitting: Psychiatry

## 2020-09-07 DIAGNOSIS — F411 Generalized anxiety disorder: Secondary | ICD-10-CM

## 2020-09-08 NOTE — Telephone Encounter (Signed)
Next apt 5/17

## 2020-10-07 ENCOUNTER — Other Ambulatory Visit: Payer: Self-pay | Admitting: Psychiatry

## 2020-10-07 DIAGNOSIS — F3130 Bipolar disorder, current episode depressed, mild or moderate severity, unspecified: Secondary | ICD-10-CM

## 2020-10-25 ENCOUNTER — Ambulatory Visit: Payer: Medicare Other | Admitting: Psychiatry

## 2020-10-31 ENCOUNTER — Other Ambulatory Visit: Payer: Self-pay

## 2020-10-31 ENCOUNTER — Encounter: Payer: Self-pay | Admitting: Psychiatry

## 2020-10-31 ENCOUNTER — Ambulatory Visit (INDEPENDENT_AMBULATORY_CARE_PROVIDER_SITE_OTHER): Payer: Medicare Other | Admitting: Psychiatry

## 2020-10-31 DIAGNOSIS — Z79899 Other long term (current) drug therapy: Secondary | ICD-10-CM

## 2020-10-31 DIAGNOSIS — F5105 Insomnia due to other mental disorder: Secondary | ICD-10-CM | POA: Diagnosis not present

## 2020-10-31 DIAGNOSIS — F3130 Bipolar disorder, current episode depressed, mild or moderate severity, unspecified: Secondary | ICD-10-CM | POA: Diagnosis not present

## 2020-10-31 NOTE — Patient Instructions (Signed)
Call In 2 weeks if not better

## 2020-10-31 NOTE — Progress Notes (Addendum)
Drew Padilla [id number] 09/29/1950 70 y.o.  Subjective:   Patient ID:  CRIT Drew Padilla is a 70 y.o. (DOB Jun 24, 1950) male.  Chief Complaint:  Chief Complaint  Patient presents with  . Follow-up  . Bipolar I disorder, most recent episode depressed ([place])  . Depression    HPI  Drew Padilla presents to the office today for follow-up of bipolar and insomnia.  seen in January 2021.  Good physical.  No otgher meds except Rx here..  12/2019 appointment without medicine changes in the following noted: Best I can.   Stress wife's health and constant caretaking.  Taking more and more out of hime and feels sort of guilty about it.    Aggressive RA is her problem and hip replacement January 2020 without help and complications.  He's doing everything.  Waring on him emotionally.  Not like retirement expected.  Staying in.  W gradual deterioration. Xanax a couple times of week.  Propranolol prn tremor. Minimal tremor. Sleep good.  10/31/2020 appointment with the following noted: Last 2 mos been the darkest bleakest in years.  Doesn't know why.  More depressed.  No energy or motivation for normal activities.  Anhedonia.  Sleep unchanged but may be more naps daytime and still 7-8 hours at night.  No particular trigger except wife's health continues to worsen and he has to do more.  Occ break briefly  Does not leave for extended periods. . No SE lithium generally.   Patient denies any recent difficulty with anxiety.  Patient denies difficulty with sleep initiation or maintenance. Denies appetite disturbance.  Patient reports that energy and motivation have been good.  Patient denies any difficulty with concentration.  Patient denies any suicidal ideation.  D works all day.  Psych med history:  Ambien CR, trazodone, mirtazapine NR, Lunesta, doxepin, hydroxyzine, unisom, Belsomra, Seroquel, temazepam, propranolol Lamotrigine, Cytomel lithium,  Pramipexole, Abilify 10 mg 2009 for several  months Fluoxetine, citalopram 80, Wellbutrin, buspirone Adderall, Strattera, Nuvigil others also Under her psychiatric care since May 2001  Review of Systems:  Review of Systems  Cardiovascular: Negative for palpitations.  Endocrine: Negative for polyuria.  Neurological: Negative for tremors and weakness.  Psychiatric/Behavioral: Positive for dysphoric mood. Negative for agitation, behavioral problems, confusion, decreased concentration, hallucinations, self-injury, sleep disturbance and suicidal ideas. The patient is not nervous/anxious and is not hyperactive.     Medications: I have reviewed the patient's current medications.  Current Outpatient Medications  Medication Sig Dispense Refill  . ALPRAZolam (XANAX) 0.5 MG tablet TAKE 1 TABLET (0.5 MG TOTAL) BY MOUTH 2 (TWO) TIMES DAILY AS NEEDED FOR ANXIETY. 45 tablet 5  . lamoTRIgine (LAMICTAL) 200 MG tablet Take 1 tablet (200 mg total) by mouth 2 (two) times daily. 180 tablet 1  . liothyronine (CYTOMEL) 25 MCG tablet TAKE 1 AND 1/2 TABLETS(37.5 MCG) BY MOUTH DAILY 135 tablet 1  . lithium carbonate 150 MG capsule TAKE 5 CAPSULES DAILY 450 capsule 0  . pramipexole (MIRAPEX) 0.25 MG tablet TAKE 1 TABLET BY MOUTH TWICE A DAY 180 tablet 0  . propranolol (INDERAL) 20 MG tablet TAKE 1-2 TABLETS (20-40 MG TOTAL) BY MOUTH 2 (TWO) TIMES DAILY AS NEEDED. 120 tablet 1  . Pyridoxine HCl (VITAMIN B-6) 500 MG tablet Take 500 mg by mouth 2 (two) times a day.    . traZODone (DESYREL) 100 MG tablet Take 2 tablets (200 mg) by mouth at bedtime 180 tablet 1  . doxylamine, Sleep, (UNISOM) 25 MG tablet Take 25 mg by  mouth at bedtime as needed for sleep. (Patient not taking: Reported on 10/31/2020)     No current facility-administered medications for this visit.    Medication Side Effects: Other: tremor managed with B6  Allergies: No Known Allergies  Past Medical History:  Diagnosis Date  . Bipolar disorder ([place])    with primary depressive symptoms  .  Chronic headaches   . Syncope 11/2015    Family History  Problem Relation Age of Onset  . Sleep apnea Father     Social History   Socioeconomic History  . Marital status: Married    Spouse name: Not on file  . Number of children: Not on file  . Years of education: Not on file  . Highest education level: Not on file  Occupational History  . Occupation: funeral service    Employer: [employer]  Tobacco Use  . Smoking status: Never Smoker  . Smokeless tobacco: Never Used  Substance and Sexual Activity  . Alcohol use: No  . Drug use: No  . Sexual activity: Not on file  Other Topics Concern  . Not on file  Social History Narrative  . Not on file   Social Determinants of Health   Financial Resource Strain: Not on file  Food Insecurity: Not on file  Transportation Needs: Not on file  Physical Activity: Not on file  Stress: Not on file  Social Connections: Not on file  Intimate Partner Violence: Not on file    Past Medical History, Surgical history, Social history, and Family history were reviewed and updated as appropriate.   Please see review of systems for further details on the patient's review from today.   Objective:   Physical Exam:  There were no vitals taken for this visit.  Physical Exam Constitutional:      General: He is not in acute distress.    Appearance: He is well-developed.  Musculoskeletal:        General: No deformity.  Neurological:     Mental Status: He is alert and oriented to person, place, and time.     Motor: No tremor.     Coordination: Coordination normal.     Gait: Gait normal.  Psychiatric:        Attention and Perception: Attention normal. He is attentive.        Mood and Affect: Mood is depressed. Mood is not anxious. Affect is not labile, blunt, angry, tearful or inappropriate.        Speech: Speech normal.        Behavior: Behavior normal.        Thought Content: Thought content normal. Thought content does not include  homicidal or suicidal ideation. Thought content does not include homicidal or suicidal plan.        Cognition and Memory: Cognition normal.        Judgment: Judgment normal.     Comments: Insight is good.     Lab Review:     Component Value Date/Time   NA 138 12/23/2019 1022   K 4.2 12/23/2019 1022   CL 104 12/23/2019 1022   CO2 25 12/23/2019 1022   GLUCOSE 84 12/23/2019 1022   BUN 14 12/23/2019 1022   CREATININE 1.13 12/23/2019 1022   CALCIUM 10.1 12/23/2019 1022   PROT 5.6 (L) 12/12/2015 0702   ALBUMIN 3.8 12/12/2015 0702   AST 18 12/12/2015 0702   ALT 15 (L) 12/12/2015 0702   ALKPHOS 45 12/12/2015 0702   BILITOT 0.8 12/12/2015 8657  GFRNONAA 60 (L) 12/12/2015 0702   GFRAA >60 12/12/2015 0702       Component Value Date/Time   WBC 6.7 12/12/2015 0702   RBC 4.65 12/12/2015 0702   HGB 13.8 12/12/2015 0702   HCT 40.8 12/12/2015 0702   PLT 120 (L) 12/12/2015 0702   MCV 87.7 12/12/2015 0702   MCH 29.7 12/12/2015 0702   MCHC 33.8 12/12/2015 0702   RDW 12.7 12/12/2015 0702   LYMPHSABS 1.3 12/11/2015 1348   MONOABS 0.4 12/11/2015 1348   EOSABS 0.1 12/11/2015 1348   BASOSABS 0.0 12/11/2015 1348   Last lithium 0.6 stable Lithium Lvl  Date Value Ref Range Status  12/23/2019 0.5 (L) 0.6 - 1.2 mmol/L Final     No results found for: PHENYTOIN, PHENOBARB, VALPROATE, CBMZ   .res Assessment: Plan:    Zander was seen today for follow-up, bipolar i disorder, most recent episode depressed ([facility]) and depression.  Diagnoses and all orders for this visit:  Bipolar I disorder, most recent episode depressed ([place]) -     Basic metabolic panel -     Lithium level -     TSH  Insomnia due to mental condition  Lithium use -     Basic metabolic panel -     Lithium level -     TSH   Relapse depression. Supportive therapy on importance self care and getting out for himself.  Disc his guilt over conflicted feelings. Disc longterm plans  Mood more depressed markedly like in  the past.  caretaking fatigue.  Counseled patient regarding potential benefits, risks, and side effects of lithium to include potential risk of lithium affecting thyroid and renal function.  Discussed need for periodic lab monitoring to determine drug level and to assess for potential adverse effects.  Counseled patient regarding signs and symptoms of lithium toxicity and advised that they notify office immediately or seek urgent medical attention if experiencing these signs and symptoms.  Patient advised to contact office with any questions or concerns. Labs from 12/2019 were normal including low normal lithium level 0.5  Due to relapse repeat labs.  Increase pramipexole to 0.375 mg BID for off label depression.  Call in 2 weeks to report effect.  FU 6 weeks.Lynder Parents, MD, DFAPA   Please see After Visit Summary for patient specific instructions.  No future appointments.  Orders Placed This Encounter  Procedures  . Basic metabolic panel  . Lithium level  . TSH      -------------------------------

## 2020-11-02 ENCOUNTER — Other Ambulatory Visit: Payer: Self-pay | Admitting: Psychiatry

## 2020-11-02 DIAGNOSIS — F3130 Bipolar disorder, current episode depressed, mild or moderate severity, unspecified: Secondary | ICD-10-CM

## 2020-11-03 ENCOUNTER — Other Ambulatory Visit: Payer: Self-pay | Admitting: Psychiatry

## 2020-11-03 DIAGNOSIS — F3130 Bipolar disorder, current episode depressed, mild or moderate severity, unspecified: Secondary | ICD-10-CM

## 2020-11-16 DIAGNOSIS — H2513 Age-related nuclear cataract, bilateral: Secondary | ICD-10-CM | POA: Diagnosis not present

## 2020-11-16 DIAGNOSIS — H25013 Cortical age-related cataract, bilateral: Secondary | ICD-10-CM | POA: Diagnosis not present

## 2020-11-16 DIAGNOSIS — H5203 Hypermetropia, bilateral: Secondary | ICD-10-CM | POA: Diagnosis not present

## 2020-11-16 DIAGNOSIS — H524 Presbyopia: Secondary | ICD-10-CM | POA: Diagnosis not present

## 2020-11-16 DIAGNOSIS — H52203 Unspecified astigmatism, bilateral: Secondary | ICD-10-CM | POA: Diagnosis not present

## 2020-11-27 DIAGNOSIS — Z79899 Other long term (current) drug therapy: Secondary | ICD-10-CM | POA: Diagnosis not present

## 2020-11-27 DIAGNOSIS — F3130 Bipolar disorder, current episode depressed, mild or moderate severity, unspecified: Secondary | ICD-10-CM | POA: Diagnosis not present

## 2020-11-28 LAB — BASIC METABOLIC PANEL
BUN: 11 mg/dL (ref 7–25)
CO2: 25 mmol/L (ref 20–32)
Calcium: 9.5 mg/dL (ref 8.6–10.3)
Chloride: 106 mmol/L (ref 98–110)
Creat: 1.01 mg/dL (ref 0.70–1.25)
Glucose, Bld: 77 mg/dL (ref 65–99)
Potassium: 4.2 mmol/L (ref 3.5–5.3)
Sodium: 138 mmol/L (ref 135–146)

## 2020-11-28 LAB — TSH: TSH: 0.25 mIU/L — ABNORMAL LOW (ref 0.40–4.50)

## 2020-11-28 LAB — LITHIUM LEVEL: Lithium Lvl: 0.4 mmol/L — ABNORMAL LOW (ref 0.6–1.2)

## 2020-12-04 NOTE — Progress Notes (Signed)
Lithium level has dropped below the therapeutic range to 0.4 on the same dose of 5 of the 150 mg capsules.  He is also relapsed into depression. At the last appointment we increased the pramipexole because of his depression.  If he is better then he does not need to change the lithium.  If his depression is not better then he needs to increase the lithium to 6 of the 150 mg capsules or we can prescribe 3 of the 300 mg capsules to try to get his lithium level up.

## 2020-12-07 ENCOUNTER — Other Ambulatory Visit: Payer: Self-pay | Admitting: Psychiatry

## 2020-12-07 DIAGNOSIS — F3130 Bipolar disorder, current episode depressed, mild or moderate severity, unspecified: Secondary | ICD-10-CM

## 2020-12-13 DIAGNOSIS — H2511 Age-related nuclear cataract, right eye: Secondary | ICD-10-CM | POA: Diagnosis not present

## 2020-12-13 DIAGNOSIS — H25812 Combined forms of age-related cataract, left eye: Secondary | ICD-10-CM | POA: Diagnosis not present

## 2020-12-13 DIAGNOSIS — H25011 Cortical age-related cataract, right eye: Secondary | ICD-10-CM | POA: Diagnosis not present

## 2020-12-15 ENCOUNTER — Other Ambulatory Visit: Payer: Self-pay

## 2020-12-15 ENCOUNTER — Telehealth: Payer: Self-pay | Admitting: Psychiatry

## 2020-12-15 DIAGNOSIS — F5105 Insomnia due to other mental disorder: Secondary | ICD-10-CM

## 2020-12-15 MED ORDER — TRAZODONE HCL 100 MG PO TABS: ORAL_TABLET | ORAL | 0 refills | Status: DC

## 2020-12-15 NOTE — Telephone Encounter (Signed)
Ok to send

## 2020-12-15 NOTE — Telephone Encounter (Signed)
Yes, please send it and let him know it was sent

## 2020-12-15 NOTE — Telephone Encounter (Signed)
Pt informed

## 2020-12-15 NOTE — Telephone Encounter (Signed)
Patient called stating he needs an emergency refill on his Trazodone. He is only requesting 8 pills while he is out of town. Refill at the [facility] in [place] where he currently is. This is a one time refill request. Direct questions and confirm refill was sent to # [phone].

## 2020-12-20 DIAGNOSIS — H2512 Age-related nuclear cataract, left eye: Secondary | ICD-10-CM | POA: Diagnosis not present

## 2020-12-20 DIAGNOSIS — H25012 Cortical age-related cataract, left eye: Secondary | ICD-10-CM | POA: Diagnosis not present

## 2020-12-25 ENCOUNTER — Other Ambulatory Visit: Payer: Self-pay

## 2020-12-25 ENCOUNTER — Telehealth: Payer: Self-pay | Admitting: Psychiatry

## 2020-12-25 DIAGNOSIS — F3130 Bipolar disorder, current episode depressed, mild or moderate severity, unspecified: Secondary | ICD-10-CM

## 2020-12-25 MED ORDER — PRAMIPEXOLE DIHYDROCHLORIDE 0.5 MG PO TABS: 0.5000 mg | ORAL_TABLET | Freq: Two times a day (BID) | ORAL | 0 refills | Status: DC

## 2020-12-25 NOTE — Telephone Encounter (Signed)
Patient has a history of treatment resistant major depression which has largely been under control over the last several years with a combination of lithium, lamotrigine and pramipexole.  At his last appointment in May he had relapsed into depression. We had him increase pramipexole to 0.375 mg twice daily which he calls and reports has not been particularly helpful. We ordered a lithium level was which was low at 0.4 and therefore it was suggested that he increase the lithium but he has been reluctant to do so due to lithium related tremors.  In terms of getting relief from depression, still the best option is to increase the lithium but I understand that will result in more tremors to be managed by propranolol or perhaps some other tremor medicine.   It is not obvious regarding the next step as there are many different options.  I do not like starting a new medication without an appointment so this put him on the cancellation list. In the meantime he can increase pramipexole 1 step further to 0.5 mg twice daily.  As we go higher it might cause some sleepiness and if that is a problem then let us know.

## 2020-12-25 NOTE — Telephone Encounter (Signed)
Rtc to pt and gave him instructions to increase Pramipexole 0.5 mg bid. New Rx sent to [facility] [address]. Advised to call back with worsening symptoms.

## 2020-12-25 NOTE — Progress Notes (Signed)
See phone message with follow up

## 2020-12-26 ENCOUNTER — Other Ambulatory Visit: Payer: Self-pay | Admitting: Psychiatry

## 2020-12-26 DIAGNOSIS — F5105 Insomnia due to other mental disorder: Secondary | ICD-10-CM

## 2021-01-06 ENCOUNTER — Other Ambulatory Visit: Payer: Self-pay | Admitting: Psychiatry

## 2021-01-06 DIAGNOSIS — F3130 Bipolar disorder, current episode depressed, mild or moderate severity, unspecified: Secondary | ICD-10-CM

## 2021-01-06 DIAGNOSIS — F5105 Insomnia due to other mental disorder: Secondary | ICD-10-CM

## 2021-01-18 ENCOUNTER — Other Ambulatory Visit: Payer: Self-pay | Admitting: Psychiatry

## 2021-01-18 DIAGNOSIS — F3130 Bipolar disorder, current episode depressed, mild or moderate severity, unspecified: Secondary | ICD-10-CM

## 2021-01-25 ENCOUNTER — Ambulatory Visit: Payer: Medicare Other | Admitting: Psychiatry

## 2021-03-25 ENCOUNTER — Other Ambulatory Visit: Payer: Self-pay | Admitting: Psychiatry

## 2021-03-25 DIAGNOSIS — F3130 Bipolar disorder, current episode depressed, mild or moderate severity, unspecified: Secondary | ICD-10-CM

## 2021-03-26 NOTE — Telephone Encounter (Signed)
90 day ok?

## 2021-04-02 ENCOUNTER — Other Ambulatory Visit: Payer: Self-pay

## 2021-04-02 ENCOUNTER — Encounter: Payer: Self-pay | Admitting: Psychiatry

## 2021-04-02 ENCOUNTER — Ambulatory Visit (INDEPENDENT_AMBULATORY_CARE_PROVIDER_SITE_OTHER): Payer: Medicare Other | Admitting: Psychiatry

## 2021-04-02 DIAGNOSIS — Z79899 Other long term (current) drug therapy: Secondary | ICD-10-CM

## 2021-04-02 DIAGNOSIS — F5105 Insomnia due to other mental disorder: Secondary | ICD-10-CM

## 2021-04-02 DIAGNOSIS — G251 Drug-induced tremor: Secondary | ICD-10-CM | POA: Diagnosis not present

## 2021-04-02 DIAGNOSIS — F3130 Bipolar disorder, current episode depressed, mild or moderate severity, unspecified: Secondary | ICD-10-CM

## 2021-04-02 DIAGNOSIS — F411 Generalized anxiety disorder: Secondary | ICD-10-CM

## 2021-04-02 MED ORDER — PROPRANOLOL HCL 40 MG PO TABS: 40.0000 mg | ORAL_TABLET | Freq: Two times a day (BID) | ORAL | 0 refills | Status: DC | PRN

## 2021-04-02 MED ORDER — ALPRAZOLAM 0.5 MG PO TABS: 0.5000 mg | ORAL_TABLET | Freq: Two times a day (BID) | ORAL | 5 refills | Status: DC | PRN

## 2021-04-02 MED ORDER — LAMOTRIGINE 200 MG PO TABS: 200.0000 mg | ORAL_TABLET | Freq: Two times a day (BID) | ORAL | 1 refills | Status: DC

## 2021-04-02 MED ORDER — LITHIUM CARBONATE 300 MG PO CAPS: 900.0000 mg | ORAL_CAPSULE | Freq: Every evening | ORAL | 0 refills | Status: DC

## 2021-04-02 MED ORDER — TRAZODONE HCL 100 MG PO TABS: ORAL_TABLET | ORAL | 0 refills | Status: DC

## 2021-04-02 MED ORDER — LIOTHYRONINE SODIUM 25 MCG PO TABS: ORAL_TABLET | ORAL | 0 refills | Status: DC

## 2021-04-02 NOTE — Progress Notes (Signed)
Drew Padilla [id number] [birth year] 70 y.o.  Subjective:   [name] is a 70 y.o. (DOB December 29, 1950) male.  Chief Complaint:  Chief Complaint  Patient presents with   Follow-up   Bipolar I disorder   Depression    HPI  Drew Padilla presents to the office today for follow-up of bipolar and insomnia.  seen in January 2021.  Good physical.  No otgher meds except Rx here..  12/2019 appointment without medicine changes in the following noted: Best I can.   Stress wife's health and constant caretaking.  Taking more and more out of hime and feels sort of guilty about it.    Aggressive RA is her problem and hip replacement January 2020 without help and complications.  He's doing everything.  Waring on him emotionally.  Not like retirement expected.  Staying in.  W gradual deterioration. Xanax a couple times of week.  Propranolol prn tremor. Minimal tremor. Sleep good.  10/31/2020 appointment with the following noted: Last 2 mos been the darkest bleakest in years.  Doesn't know why.  More depressed.  No energy or motivation for normal activities.  Anhedonia.  Sleep unchanged but may be more naps daytime and still 7-8 hours at night.  No particular trigger except wife's health continues to worsen and he has to do more.  Occ break briefly  Does not leave for extended periods. . No SE lithium generally. Plan:Labs from 12/2019 were normal including low normal lithium level 0.5  Due to relapse repeat labs. Increase pramipexole to 0.375 mg BID for off label depression.  Call in 2 weeks to report effect.  10/31/20 Note on labs and meds: Lithium level has dropped below the therapeutic range to 0.4 on the same dose of 5 of the 150 mg capsules.  He is also relapsed into depression. At the last appointment we increased the pramipexole because of his depression.  If he is better then he does not need to change the lithium.  If his depression is not better then he needs to increase  the lithium to 6 of the 150 mg capsules or we can prescribe 3 of the 300 mg capsules to try to get his lithium level up.  12/25/20 MD noted: Patient has a history of treatment resistant major depression which has largely been under control over the last several years with a combination of lithium, lamotrigine and pramipexole.  At his last appointment in May he had relapsed into depression. We had him increase pramipexole to 0.375 mg twice daily which he calls and reports has not been particularly helpful. We ordered a lithium level was which was low at 0.4 and therefore it was suggested that he increase the lithium but he has been reluctant to do so due to lithium related tremors.  In terms of getting relief from depression, still the best option is to increase the lithium but I understand that will result in more tremors to be managed by propranolol or perhaps some other tremor medicine.   It is not obvious regarding the next step as there are many different options.  I do not like starting a new medication without an appointment so this put him on the cancellation list. In the meantime he can increase pramipexole 1 step further to 0.5 mg twice daily.  As we go higher it might cause some sleepiness and if that is a problem then let us know.   04/02/2021 appointment with the following noted: Doing some better.  Dep can comes in waves and last 1-2 days wihtout pattern nor trigger. Lithium 900 made tremors constant.  Lithium 750 tolerable. Increase pramipexole to 0.5 BID definitely helped. No SE. Stress wife's health. Using propranolol 20-40 prn tremor. Asked about CBD.   Patient denies any recent difficulty with anxiety.  Patient denies difficulty with sleep initiation or maintenance. Denies appetite disturbance.  Patient reports that energy and motivation have been good.  Patient denies any difficulty with concentration.  Patient denies any suicidal ideation.  D works all day.  Psych med history:   Ambien CR, trazodone, mirtazapine NR, Lunesta, doxepin, hydroxyzine, unisom, Belsomra, Seroquel, temazepam, propranolol Lamotrigine, Cytomel lithium,   Pramipexole 0.5 helped Abilify 10 mg 2009 for several months Fluoxetine, citalopram 80, Wellbutrin, buspirone Adderall, Strattera, Nuvigil others also Under her psychiatric care since May 2001  Review of Systems:  Review of Systems  Cardiovascular:  Negative for chest pain and palpitations.  Endocrine: Negative for polyuria.  Neurological:  Negative for tremors and weakness.  Psychiatric/Behavioral:  Positive for dysphoric mood. Negative for agitation, behavioral problems, confusion, decreased concentration, hallucinations, self-injury, sleep disturbance and suicidal ideas. The patient is not nervous/anxious and is not hyperactive.    Medications: I have reviewed the patient's current medications.  Current Outpatient Medications  Medication Sig Dispense Refill   pramipexole (MIRAPEX) 0.5 MG tablet TAKE 1 TABLET BY MOUTH 2 TIMES DAILY. 180 tablet 0   Pyridoxine HCl (VITAMIN B-6) 500 MG tablet Take 500 mg by mouth 2 (two) times a day.     ALPRAZolam (XANAX) 0.5 MG tablet Take 1 tablet (0.5 mg total) by mouth 2 (two) times daily as needed for anxiety. 45 tablet 5   lamoTRIgine (LAMICTAL) 200 MG tablet Take 1 tablet (200 mg total) by mouth 2 (two) times daily. 180 tablet 1   liothyronine (CYTOMEL) 25 MCG tablet TAKE 1 AND 1/2 TABLETS(37.5 MCG) BY MOUTH DAILY 135 tablet 0   lithium carbonate 300 MG capsule Take 3 capsules (900 mg total) by mouth at bedtime. 270 capsule 0   propranolol (INDERAL) 40 MG tablet Take 1 tablet (40 mg total) by mouth 2 (two) times daily as needed (tremor). 180 tablet 0   traZODone (DESYREL) 100 MG tablet TAKE 2 TABLETS BY MOUTH AT BEDTIME 180 tablet 0   No current facility-administered medications for this visit.    Medication Side Effects: Other: tremor managed with B6  Allergies: No Known Allergies  Past  Medical History:  Diagnosis Date   Bipolar disorder ([place])    with primary depressive symptoms   Chronic headaches    Syncope 11/2015    Family History  Problem Relation Age of Onset   Sleep apnea Father     Social History   Socioeconomic History   Marital status: Married    Spouse name: Not on file   Number of children: Not on file   Years of education: Not on file   Highest education level: Not on file  Occupational History   Occupation: funeral service    Employer: [employer]  Tobacco Use   Smoking status: Never   Smokeless tobacco: Never  Substance and Sexual Activity   Alcohol use: No   Drug use: No   Sexual activity: Not on file  Other Topics Concern   Not on file  Social History Narrative   Not on file   Social Determinants of Health   Financial Resource Strain: Not on file  Food Insecurity: Not on file  Transportation Needs: Not  on file  Physical Activity: Not on file  Stress: Not on file  Social Connections: Not on file  Intimate Partner Violence: Not on file    Past Medical History, Surgical history, Social history, and Family history were reviewed and updated as appropriate.   Please see review of systems for further details on the patient's review from today.   Objective:   Physical Exam:  There were no vitals taken for this visit.  Physical Exam Constitutional:      General: He is not in acute distress.    Appearance: He is well-developed.  Musculoskeletal:        General: No deformity.  Neurological:     Mental Status: He is alert and oriented to person, place, and time.     Motor: No tremor.     Coordination: Coordination normal.     Gait: Gait normal.  Psychiatric:        Attention and Perception: Attention normal. He is attentive.        Mood and Affect: Mood is depressed. Mood is not anxious. Affect is not labile, blunt, angry, tearful or inappropriate.        Speech: Speech normal.        Behavior: Behavior normal.         Thought Content: Thought content normal. Thought content does not include homicidal or suicidal ideation. Thought content does not include homicidal or suicidal plan.        Cognition and Memory: Cognition normal.        Judgment: Judgment normal.     Comments: Insight is good. Dep in brief bouts    Lab Review:     Component Value Date/Time   NA 138 11/27/2020 1331   K 4.2 11/27/2020 1331   CL 106 11/27/2020 1331   CO2 25 11/27/2020 1331   GLUCOSE 77 11/27/2020 1331   BUN 11 11/27/2020 1331   CREATININE 1.01 11/27/2020 1331   CALCIUM 9.5 11/27/2020 1331   PROT 5.6 (L) 12/12/2015 0702   ALBUMIN 3.8 12/12/2015 0702   AST 18 12/12/2015 0702   ALT 15 (L) 12/12/2015 0702   ALKPHOS 45 12/12/2015 0702   BILITOT 0.8 12/12/2015 0702   GFRNONAA 60 (L) 12/12/2015 0702   GFRAA >60 12/12/2015 0702       Component Value Date/Time   WBC 6.7 12/12/2015 0702   RBC 4.65 12/12/2015 0702   HGB 13.8 12/12/2015 0702   HCT 40.8 12/12/2015 0702   PLT 120 (L) 12/12/2015 0702   MCV 87.7 12/12/2015 0702   MCH 29.7 12/12/2015 0702   MCHC 33.8 12/12/2015 0702   RDW 12.7 12/12/2015 0702   LYMPHSABS 1.3 12/11/2015 1348   MONOABS 0.4 12/11/2015 1348   EOSABS 0.1 12/11/2015 1348   BASOSABS 0.0 12/11/2015 1348    Lithium Lvl  Date Value Ref Range Status  11/27/2020 0.4 (L) 0.6 - 1.2 mmol/L Final   10/31/20 Note on labs and meds: Lithium level has dropped below the therapeutic range to 0.4 on the same dose of 5 of the 150 mg capsules.   No results found for: PHENYTOIN, PHENOBARB, VALPROATE, CBMZ   .res Assessment: Plan:    Napoleon was seen today for follow-up, bipolar i disorder and depression.  Diagnoses and all orders for this visit:  Bipolar I disorder, most recent episode depressed ([facility]) -     lithium carbonate 300 MG capsule; Take 3 capsules (900 mg total) by mouth at bedtime. -     liothyronine (CYTOMEL)  25 MCG tablet; TAKE 1 AND 1/2 TABLETS(37.5 MCG) BY MOUTH DAILY -     lamoTRIgine  (LAMICTAL) 200 MG tablet; Take 1 tablet (200 mg total) by mouth 2 (two) times daily.  Lithium-induced tremor -     propranolol (INDERAL) 40 MG tablet; Take 1 tablet (40 mg total) by mouth 2 (two) times daily as needed (tremor).  Insomnia due to mental condition -     traZODone (DESYREL) 100 MG tablet; TAKE 2 TABLETS BY MOUTH AT BEDTIME  Lithium use  Anxiety state -     ALPRAZolam (XANAX) 0.5 MG tablet; Take 1 tablet (0.5 mg total) by mouth 2 (two) times daily as needed for anxiety.  Relapse depression partially responsive to pramipexole increase Doesn't tolerate lithium 900 dT tremor.  Supportive therapy on importance self care and getting out for himself.  Disc his guilt over conflicted feelings. Disc longterm plans  Mood less depressed markedly like in the past.  caretaking fatigue.  Counseled patient regarding potential benefits, risks, and side effects of lithium to include potential risk of lithium affecting thyroid and renal function.  Discussed need for periodic lab monitoring to determine drug level and to assess for potential adverse effects.  Counseled patient regarding signs and symptoms of lithium toxicity and advised that they notify office immediately or seek urgent medical attention if experiencing these signs and symptoms.  Patient advised to contact office with any questions or concerns. Lithium level too low lately at 0.4 on 750 mg daily Increase lithium again to 900/750 QOD Increase propranolol to 40 mg BID prn tremor.  He usually takes just 20  Continue pramipexole to 0.5 mg BID for off label depression.    FU 3 mos  Lynder Parents, MD, DFAPA   Please see After Visit Summary for patient specific instructions.  Future Appointments  Date Time Provider [place]  07/04/2021  9:30 AM Cottle, Billey Co., MD [facility] None    No orders of the defined types were placed in this encounter.     -------------------------------

## 2021-04-13 ENCOUNTER — Other Ambulatory Visit: Payer: Self-pay | Admitting: Psychiatry

## 2021-04-13 DIAGNOSIS — F3130 Bipolar disorder, current episode depressed, mild or moderate severity, unspecified: Secondary | ICD-10-CM

## 2021-04-26 DIAGNOSIS — Z961 Presence of intraocular lens: Secondary | ICD-10-CM | POA: Diagnosis not present

## 2021-05-03 DIAGNOSIS — R1013 Epigastric pain: Secondary | ICD-10-CM | POA: Diagnosis not present

## 2021-05-03 DIAGNOSIS — U071 COVID-19: Secondary | ICD-10-CM | POA: Diagnosis not present

## 2021-05-03 DIAGNOSIS — R112 Nausea with vomiting, unspecified: Secondary | ICD-10-CM | POA: Diagnosis not present

## 2021-05-03 DIAGNOSIS — R1010 Upper abdominal pain, unspecified: Secondary | ICD-10-CM | POA: Diagnosis not present

## 2021-05-03 DIAGNOSIS — Z23 Encounter for immunization: Secondary | ICD-10-CM | POA: Diagnosis not present

## 2021-05-18 DIAGNOSIS — R109 Unspecified abdominal pain: Secondary | ICD-10-CM | POA: Diagnosis not present

## 2021-05-18 DIAGNOSIS — K760 Fatty (change of) liver, not elsewhere classified: Secondary | ICD-10-CM | POA: Diagnosis not present

## 2021-05-18 DIAGNOSIS — R1010 Upper abdominal pain, unspecified: Secondary | ICD-10-CM | POA: Diagnosis not present

## 2021-06-25 ENCOUNTER — Other Ambulatory Visit: Payer: Self-pay | Admitting: Psychiatry

## 2021-06-25 DIAGNOSIS — G251 Drug-induced tremor: Secondary | ICD-10-CM

## 2021-06-25 DIAGNOSIS — F3130 Bipolar disorder, current episode depressed, mild or moderate severity, unspecified: Secondary | ICD-10-CM

## 2021-06-29 ENCOUNTER — Other Ambulatory Visit: Payer: Self-pay | Admitting: Psychiatry

## 2021-06-29 DIAGNOSIS — F3130 Bipolar disorder, current episode depressed, mild or moderate severity, unspecified: Secondary | ICD-10-CM

## 2021-07-04 ENCOUNTER — Other Ambulatory Visit: Payer: Self-pay

## 2021-07-04 ENCOUNTER — Encounter: Payer: Self-pay | Admitting: Psychiatry

## 2021-07-04 ENCOUNTER — Ambulatory Visit (INDEPENDENT_AMBULATORY_CARE_PROVIDER_SITE_OTHER): Payer: Medicare Other | Admitting: Psychiatry

## 2021-07-04 DIAGNOSIS — G251 Drug-induced tremor: Secondary | ICD-10-CM

## 2021-07-04 DIAGNOSIS — F5105 Insomnia due to other mental disorder: Secondary | ICD-10-CM | POA: Diagnosis not present

## 2021-07-04 DIAGNOSIS — F3130 Bipolar disorder, current episode depressed, mild or moderate severity, unspecified: Secondary | ICD-10-CM | POA: Diagnosis not present

## 2021-07-04 DIAGNOSIS — Z79899 Other long term (current) drug therapy: Secondary | ICD-10-CM

## 2021-07-04 MED ORDER — TRAZODONE HCL 100 MG PO TABS: ORAL_TABLET | ORAL | 1 refills | Status: DC

## 2021-07-04 MED ORDER — PROPRANOLOL HCL 40 MG PO TABS: 40.0000 mg | ORAL_TABLET | Freq: Two times a day (BID) | ORAL | 1 refills | Status: DC | PRN

## 2021-07-04 MED ORDER — PRAMIPEXOLE DIHYDROCHLORIDE 0.5 MG PO TABS: 0.5000 mg | ORAL_TABLET | Freq: Two times a day (BID) | ORAL | 1 refills | Status: DC

## 2021-07-04 MED ORDER — LAMOTRIGINE 200 MG PO TABS: 200.0000 mg | ORAL_TABLET | Freq: Two times a day (BID) | ORAL | 1 refills | Status: DC

## 2021-07-04 MED ORDER — LIOTHYRONINE SODIUM 25 MCG PO TABS: ORAL_TABLET | ORAL | 1 refills | Status: DC

## 2021-07-04 NOTE — Progress Notes (Signed)
Drew Padilla [id number] June 21, 1950 71 y.o.  Subjective:   [name] is a 71 y.o. (DOB February 02, 1951) male.  Chief Complaint:  Chief Complaint  Patient presents with   Follow-up    Bipolar I disorder, most recent episode depressed [facility])   Stress    Caring for wife    HPI  Drew CHARLOT presents to the office today for follow-up of bipolar and insomnia.  seen in January 2021.  Good physical.  No otgher meds except Rx here..  12/2019 appointment without medicine changes in the following noted: Best I can.   Stress wife's health and constant caretaking.  Taking more and more out of hime and feels sort of guilty about it.    Aggressive RA is her problem and hip replacement January 2020 without help and complications.  He's doing everything.  Waring on him emotionally.  Not like retirement expected.  Staying in.  W gradual deterioration. Xanax a couple times of week.  Propranolol prn tremor. Minimal tremor. Sleep good.  10/31/2020 appointment with the following noted: Last 2 mos been the darkest bleakest in years.  Doesn't know why.  More depressed.  No energy or motivation for normal activities.  Anhedonia.  Sleep unchanged but may be more naps daytime and still 7-8 hours at night.  No particular trigger except wife's health continues to worsen and he has to do more.  Occ break briefly  Does not leave for extended periods. . No SE lithium generally. Plan:Labs from 12/2019 were normal including low normal lithium level 0.5  Due to relapse repeat labs. Increase pramipexole to 0.375 mg BID for off label depression.  Call in 2 weeks to report effect.  10/31/20 Note on labs and meds: Lithium level has dropped below the therapeutic range to 0.4 on the same dose of 5 of the 150 mg capsules.  He is also relapsed into depression. At the last appointment we increased the pramipexole because of his depression.  If he is better then he does not need to change the lithium.  If  his depression is not better then he needs to increase the lithium to 6 of the 150 mg capsules or we can prescribe 3 of the 300 mg capsules to try to get his lithium level up.  12/25/20 MD noted: Patient has a history of treatment resistant major depression which has largely been under control over the last several years with a combination of lithium, lamotrigine and pramipexole.  At his last appointment in May he had relapsed into depression. We had him increase pramipexole to 0.375 mg twice daily which he calls and reports has not been particularly helpful. We ordered a lithium level was which was low at 0.4 and therefore it was suggested that he increase the lithium but he has been reluctant to do so due to lithium related tremors.  In terms of getting relief from depression, still the best option is to increase the lithium but I understand that will result in more tremors to be managed by propranolol or perhaps some other tremor medicine.   It is not obvious regarding the next step as there are many different options.  I do not like starting a new medication without an appointment so this put him on the cancellation list. In the meantime he can increase pramipexole 1 step further to 0.5 mg twice daily.  As we go higher it might cause some sleepiness and if that is a problem then let us  know.   04/02/2021 appointment with the following noted: Doing some better.  Dep can comes in waves and last 1-2 days wihtout pattern nor trigger. Lithium 900 made tremors constant.  Lithium 750 tolerable. Increase pramipexole to 0.5 BID definitely helped. No SE. Stress wife's health. Using propranolol 20-40 prn tremor. Asked about CBD. Plan: Lithium level too low lately at 0.4 on 750 mg daily Increase lithium again to 900/750 QOD Increase propranolol to 40 mg BID prn tremor.  He usually takes just 20 Continue pramipexole to 0.5 mg BID for off label depression.    07/04/2021 appt noted: Doing OK.  Not   consistently depressed. Did increase lithium. Wife's health still bad and constant stress with multiple issues.  Multiple hip surgeries and dislocations.  Bulk of time is caring for her.  No real help with her.  No family here but some around.  He doesn't ask for it.  W has RA.   Patient denies any recent difficulty with anxiety.  Patient denies difficulty with sleep initiation or maintenance. Denies appetite disturbance.  Patient reports that energy and motivation have been good.  Patient denies any difficulty with concentration.  Patient denies any suicidal ideation.  D works all day.  Psych med history:  Ambien CR, trazodone, mirtazapine NR, Lunesta, doxepin, hydroxyzine, unisom, Belsomra, Seroquel, temazepam, propranolol Lamotrigine, Cytomel lithium,   Pramipexole 0.5 helped Abilify 10 mg 2009 for several months Fluoxetine, citalopram 80, Wellbutrin, buspirone Adderall, Strattera, Nuvigil others also Under her psychiatric care since May 2001  Review of Systems:  Review of Systems  Cardiovascular:  Negative for chest pain and palpitations.  Gastrointestinal:  Negative for diarrhea.  Endocrine: Negative for polyuria.  Neurological:  Negative for tremors and weakness.  Psychiatric/Behavioral:  Positive for dysphoric mood. Negative for agitation, behavioral problems, confusion, decreased concentration, hallucinations, self-injury, sleep disturbance and suicidal ideas. The patient is not nervous/anxious and is not hyperactive.    Medications: I have reviewed the patient's current medications.  Current Outpatient Medications  Medication Sig Dispense Refill   ALPRAZolam (XANAX) 0.5 MG tablet Take 1 tablet (0.5 mg total) by mouth 2 (two) times daily as needed for anxiety. 45 tablet 5   lithium carbonate 300 MG capsule TAKE 3 CAPSULES BY MOUTH AT BEDTIME. (Patient taking differently: 900 mg every other day,750 mg every other day) 270 capsule 0   Pyridoxine HCl (VITAMIN B-6) 500 MG tablet  Take 500 mg by mouth 2 (two) times a day.     lamoTRIgine (LAMICTAL) 200 MG tablet Take 1 tablet (200 mg total) by mouth 2 (two) times daily. 180 tablet 1   liothyronine (CYTOMEL) 25 MCG tablet TAKE 1 AND 1/2 TABLETS(37.5 MCG) BY MOUTH DAILY 135 tablet 1   pramipexole (MIRAPEX) 0.5 MG tablet Take 1 tablet (0.5 mg total) by mouth 2 (two) times daily. 180 tablet 1   propranolol (INDERAL) 40 MG tablet Take 1 tablet (40 mg total) by mouth 2 (two) times daily as needed (tremor). 180 tablet 1   traZODone (DESYREL) 100 MG tablet TAKE 2 TABLETS BY MOUTH AT BEDTIME 180 tablet 1   No current facility-administered medications for this visit.    Medication Side Effects: Other: tremor managed with B6  Allergies: No Known Allergies  Past Medical History:  Diagnosis Date   Bipolar disorder ([place])    with primary depressive symptoms   Chronic headaches    Syncope 11/2015    Family History  Problem Relation Age of Onset   Sleep apnea Father  Social History   Socioeconomic History   Marital status: Married    Spouse name: Not on file   Number of children: Not on file   Years of education: Not on file   Highest education level: Not on file  Occupational History   Occupation: funeral service    Employer: [employer]  Tobacco Use   Smoking status: Never   Smokeless tobacco: Never  Substance and Sexual Activity   Alcohol use: No   Drug use: No   Sexual activity: Not on file  Other Topics Concern   Not on file  Social History Narrative   Not on file   Social Determinants of Health   Financial Resource Strain: Not on file  Food Insecurity: Not on file  Transportation Needs: Not on file  Physical Activity: Not on file  Stress: Not on file  Social Connections: Not on file  Intimate Partner Violence: Not on file    Past Medical History, Surgical history, Social history, and Family history were reviewed and updated as appropriate.   Please see review of systems for further  details on the patient's review from today.   Objective:   Physical Exam:  There were no vitals taken for this visit.  Physical Exam Constitutional:      General: He is not in acute distress.    Appearance: He is well-developed.  Musculoskeletal:        General: No deformity.  Neurological:     Mental Status: He is alert and oriented to person, place, and time.     Motor: No tremor.     Coordination: Coordination normal.     Gait: Gait normal.  Psychiatric:        Attention and Perception: Attention normal. He is attentive.        Mood and Affect: Mood is depressed. Mood is not anxious. Affect is not labile, blunt, angry, tearful or inappropriate.        Speech: Speech normal.        Behavior: Behavior normal.        Thought Content: Thought content normal. Thought content is not delusional. Thought content does not include homicidal or suicidal ideation. Thought content does not include suicidal plan.        Cognition and Memory: Cognition normal.        Judgment: Judgment normal.     Comments: Insight is good. Dep in brief bouts    Lab Review:     Component Value Date/Time   NA 138 11/27/2020 1331   K 4.2 11/27/2020 1331   CL 106 11/27/2020 1331   CO2 25 11/27/2020 1331   GLUCOSE 77 11/27/2020 1331   BUN 11 11/27/2020 1331   CREATININE 1.01 11/27/2020 1331   CALCIUM 9.5 11/27/2020 1331   PROT 5.6 (L) 12/12/2015 0702   ALBUMIN 3.8 12/12/2015 0702   AST 18 12/12/2015 0702   ALT 15 (L) 12/12/2015 0702   ALKPHOS 45 12/12/2015 0702   BILITOT 0.8 12/12/2015 0702   GFRNONAA 60 (L) 12/12/2015 0702   GFRAA >60 12/12/2015 0702       Component Value Date/Time   WBC 6.7 12/12/2015 0702   RBC 4.65 12/12/2015 0702   HGB 13.8 12/12/2015 0702   HCT 40.8 12/12/2015 0702   PLT 120 (L) 12/12/2015 0702   MCV 87.7 12/12/2015 0702   MCH 29.7 12/12/2015 0702   MCHC 33.8 12/12/2015 0702   RDW 12.7 12/12/2015 0702   LYMPHSABS 1.3 12/11/2015 1348  MONOABS 0.4 12/11/2015 1348    EOSABS 0.1 12/11/2015 1348   BASOSABS 0.0 12/11/2015 1348    Lithium Lvl  Date Value Ref Range Status  11/27/2020 0.4 (L) 0.6 - 1.2 mmol/L Final   10/31/20 Note on labs and meds: Lithium level has dropped below the therapeutic range to 0.4 on the same dose of 5 of the 150 mg capsules.   No results found for: PHENYTOIN, PHENOBARB, VALPROATE, CBMZ   .res Assessment: Plan:    Derrien was seen today for follow-up and stress.  Diagnoses and all orders for this visit:  Bipolar I disorder, most recent episode depressed ([facility]) -     lamoTRIgine (LAMICTAL) 200 MG tablet; Take 1 tablet (200 mg total) by mouth 2 (two) times daily. -     pramipexole (MIRAPEX) 0.5 MG tablet; Take 1 tablet (0.5 mg total) by mouth 2 (two) times daily. -     liothyronine (CYTOMEL) 25 MCG tablet; TAKE 1 AND 1/2 TABLETS(37.5 MCG) BY MOUTH DAILY  Lithium-induced tremor -     propranolol (INDERAL) 40 MG tablet; Take 1 tablet (40 mg total) by mouth 2 (two) times daily as needed (tremor).  Insomnia due to mental condition -     traZODone (DESYREL) 100 MG tablet; TAKE 2 TABLETS BY MOUTH AT BEDTIME  Lithium use   Relapse depression partially responsive to pramipexole increase Doesn't tolerate lithium 900 dT tremor.  Supportive therapy on importance self care and getting out for himself.  Disc his guilt over conflicted feelings in detail. Disc longterm plans.  Disc care-giving stress and need for respite at times.    Mood less depressed markedly like in the past.  caretaking fatigue.  Emphasized getting breaks from this for his own physical and mental well-being.  Counseled patient regarding potential benefits, risks, and side effects of lithium to include potential risk of lithium affecting thyroid and renal function.  Discussed need for periodic lab monitoring to determine drug level and to assess for potential adverse effects.  Counseled patient regarding signs and symptoms of lithium toxicity and advised that they  notify office immediately or seek urgent medical attention if experiencing these signs and symptoms.  Patient advised to contact office with any questions or concerns. Lithium level too low lately at 0.4 on 750 mg daily continue lithium again to 900/750 QOD Tremor better with  propranolol to 40 mg BID   Continue pramipexole to 0.5 mg BID for off label depression.    FU 4-6 mos  Lynder Parents, MD, DFAPA   Please see After Visit Summary for patient specific instructions.  No future appointments.   No orders of the defined types were placed in this encounter.      -------------------------------

## 2021-07-11 DIAGNOSIS — U071 COVID-19: Secondary | ICD-10-CM | POA: Diagnosis not present

## 2021-09-03 ENCOUNTER — Other Ambulatory Visit: Payer: Self-pay

## 2021-09-03 ENCOUNTER — Telehealth: Payer: Self-pay | Admitting: Psychiatry

## 2021-09-03 ENCOUNTER — Other Ambulatory Visit: Payer: Self-pay | Admitting: Psychiatry

## 2021-09-03 DIAGNOSIS — F3130 Bipolar disorder, current episode depressed, mild or moderate severity, unspecified: Secondary | ICD-10-CM

## 2021-09-03 MED ORDER — LITHIUM CARBONATE 300 MG PO CAPS: 900.0000 mg | ORAL_CAPSULE | Freq: Every day | ORAL | 0 refills | Status: DC

## 2021-09-03 NOTE — Telephone Encounter (Signed)
Ronalee Belts called in to say that he wasn't sure why the lithium carbonate '150mg'$  was refused or denied. He does take the '150mg'$  ( 5 per day) on alternate days. Can we recheck the dose and refill? ?

## 2021-09-03 NOTE — Telephone Encounter (Signed)
Rx sent 

## 2021-09-26 ENCOUNTER — Other Ambulatory Visit: Payer: Self-pay | Admitting: Psychiatry

## 2021-09-26 DIAGNOSIS — F3130 Bipolar disorder, current episode depressed, mild or moderate severity, unspecified: Secondary | ICD-10-CM

## 2021-10-09 ENCOUNTER — Other Ambulatory Visit: Payer: Self-pay | Admitting: Psychiatry

## 2021-10-09 DIAGNOSIS — F411 Generalized anxiety disorder: Secondary | ICD-10-CM

## 2021-10-25 DIAGNOSIS — R269 Unspecified abnormalities of gait and mobility: Secondary | ICD-10-CM | POA: Diagnosis not present

## 2021-10-25 DIAGNOSIS — G459 Transient cerebral ischemic attack, unspecified: Secondary | ICD-10-CM | POA: Diagnosis not present

## 2021-10-30 DIAGNOSIS — G459 Transient cerebral ischemic attack, unspecified: Secondary | ICD-10-CM | POA: Diagnosis not present

## 2021-10-30 DIAGNOSIS — R200 Anesthesia of skin: Secondary | ICD-10-CM | POA: Diagnosis not present

## 2021-10-30 DIAGNOSIS — R262 Difficulty in walking, not elsewhere classified: Secondary | ICD-10-CM | POA: Diagnosis not present

## 2021-10-30 DIAGNOSIS — R4789 Other speech disturbances: Secondary | ICD-10-CM | POA: Diagnosis not present

## 2021-11-28 ENCOUNTER — Encounter: Payer: Self-pay | Admitting: *Deleted

## 2021-11-29 ENCOUNTER — Encounter: Payer: Self-pay | Admitting: Neurology

## 2021-11-29 ENCOUNTER — Ambulatory Visit (INDEPENDENT_AMBULATORY_CARE_PROVIDER_SITE_OTHER): Payer: Medicare Other | Admitting: Neurology

## 2021-11-29 VITALS — BP 104/67 | HR 58 | Ht 67.0 in | Wt 189.6 lb

## 2021-11-29 DIAGNOSIS — R29818 Other symptoms and signs involving the nervous system: Secondary | ICD-10-CM

## 2021-11-29 DIAGNOSIS — G459 Transient cerebral ischemic attack, unspecified: Secondary | ICD-10-CM | POA: Diagnosis not present

## 2021-11-29 NOTE — Progress Notes (Signed)
Subjective:    Patient ID: Drew Padilla is a 71 y.o. male.  HPI    Star Age, MD, PhD [facility] [address], [address] [place], [place] [address]  Dear Dr. Marisue Humble,  I saw your patient, Drew Padilla, upon your kind request in my neurologic clinic today for initial consultation of his recent neurological symptoms, concern for TIA.  The patient is unaccompanied today.  As you know, Drew Padilla is a 71 year old right-handed gentleman with an underlying medical history of diverticulosis, sleep apnea (? By chart review), tremor, bipolar disorder, and overweight state, who reports an episode of incoordination and slurring of speech in early May 2023.  Symptoms lasted for a few minutes.  He had left-sided numbness at the time.  He did not fall.  He did not go to the emergency room.  He did go to your office a few days later.  I reviewed your office visit note from 10/25/2021.  He did not have a headache at the time.  He had seen Drew Padilla in this office in the remote past for concern for atypical migraine.  The head without contrast as well as MRI head without contrast on 09/26/2007 and I reviewed the results: No acute intracranial abnormality or significant change.  Stable tiny expansion of the anterior communicating artery which may represent an aneurysm.  This measures 2 mm maximally and is unchanged from November 2007.  He had seen neurosurgery at the time.  He had a head CT without contrast on 12/11/2015 and I reviewed the results: Impression: Study within normal limits.  He had blood work through your office on 10/25/2021 and I reviewed the results: CMP was benign with a glucose of 95, BUN 12, creatinine 108, alk phos 83, AST 17, ALT 15.  CBC with differential and platelets benign with hemoglobin 14.3, hematocrit 42, MCV 90, MCH 30.7, platelets 176.  He had a brain MRI with and without contrast through [facility] on 10/30/2021 and I reviewed the results:  IMPRESSION:  Unremarkable brain MRI.  No explanation for symptoms.    He has started taking baby aspirin.  He is not sure if he had a cholesterol check and thyroid function checked recently.  He was not able to pull up his patient portal app on his phone.  He has not had any recurrence of symptoms.  He feels like his gait is not as coordinated, of note, he takes several psychotropic medications through psychiatry.  In particular, he is on Lamictal, lithium, Mirapex, trazodone, and Xanax which is as needed.  He has had dose changes but has been essentially on these medications for years.  He has no family history of stroke.  He does not sleep very well through the night.  He does not snore currently.  He had a sleep study some 7 years ago which was negative for sleep apnea per patient report.  He quit smoking some 40 years ago.  He currently does not drink any alcohol and no caffeine on a daily basis.  He had an updated eye examination in April 2023.  His Past Medical History Is Significant For: Past Medical History:  Diagnosis Date   Adenomatous colon polyp 2003   on 2003 colonoscopy (and on 2013 colon; numerous; next 01/2015)   Bipolar disorder ([place])    with primary depressive symptoms   Chronic headaches    Diverticulosis 2003   on colonoscopy   Melanoma ([place]) 1990   R arm  Obstructive sleep apnea    Syncope 11/16/2015   Tremor     His Past Surgical History Is Significant For: Past Surgical History:  Procedure Laterality Date   CHOLECYSTECTOMY  08/2000   colonoscopy with polyp resection      MELANOMA EXCISION  1990    His Family History Is Significant For: Family History  Problem Relation Age of Onset   Hypertension Father    Sleep apnea Father    Coronary artery disease Father    Diabetes Father     His Social History Is Significant For: Social History   Socioeconomic History   Marital status: Married    Spouse name: Drew Padilla   Number of children: Not on file    Years of education: Not on file   Highest education level: Not on file  Occupational History   Occupation: funeral service    Employer: [employer]  Tobacco Use   Smoking status: Former    Types: Cigarettes    Quit date: 1990    Years since quitting: 33.4   Smokeless tobacco: Never  Substance and Sexual Activity   Alcohol use: No   Drug use: No   Sexual activity: Not on file  Other Topics Concern   Not on file  Social History Narrative   Caffeine: 1 serving daily   Social Determinants of Health   Financial Resource Strain: Not on file  Food Insecurity: Not on file  Transportation Needs: Not on file  Physical Activity: Not on file  Stress: Not on file  Social Connections: Not on file    His Allergies Are:  No Known Allergies:   His Current Medications Are:  Outpatient Encounter Medications as of 11/29/2021  Medication Sig   ALPRAZolam (XANAX) 0.5 MG tablet TAKE 1 TABLET BY MOUTH 2 TIMES DAILY AS NEEDED FOR ANXIETY.   aspirin EC 81 MG tablet Take 81 mg by mouth daily. Swallow whole.   lamoTRIgine (LAMICTAL) 200 MG tablet Take 1 tablet (200 mg total) by mouth 2 (two) times daily.   liothyronine (CYTOMEL) 25 MCG tablet TAKE 1 AND 1/2 TABLETS(37.5 MCG) BY MOUTH DAILY   lithium carbonate 150 MG capsule TAKE 5 CAPSULES DAILY   lithium carbonate 300 MG capsule Take 3 capsules (900 mg total) by mouth at bedtime.   pramipexole (MIRAPEX) 0.5 MG tablet Take 1 tablet (0.5 mg total) by mouth 2 (two) times daily.   propranolol (INDERAL) 40 MG tablet Take 1 tablet (40 mg total) by mouth 2 (two) times daily as needed (tremor).   Pyridoxine HCl (VITAMIN B-6) 500 MG tablet Take 500 mg by mouth 2 (two) times a day.   traZODone (DESYREL) 100 MG tablet TAKE 2 TABLETS BY MOUTH AT BEDTIME   [DISCONTINUED] aspirin EC 81 MG tablet 1 tablet   No facility-administered encounter medications on file as of 11/29/2021.  :   Review of Systems:  Out of a complete 14 point review of systems, all  are reviewed and negative with the exception of these symptoms as listed below:   Review of Systems  Neurological:        Had episode of L arm / leg numbness, could not speak about month ago. ? TIA.  Did not go to ED. Lasted about 1-2 min or so.  Could not make contact with coffee cup with L hand when sitting in recliner.   Continues with sporadic stumbling L leg.  MRI negative by pcp.  On medications for bipolar/depression which cause tremors so takes  medication for that.      Objective:  Neurological Exam  Physical Exam Physical Examination:   Vitals:   11/29/21 0809  BP: 104/67  Pulse: (!) 58    General Examination: The patient is a very pleasant 71 y.o. male in no acute distress. He appears well-developed and well-nourished and well groomed.   HEENT: Normocephalic, atraumatic, pupils are equal, round and reactive to light, extraocular tracking is good without limitation to gaze excursion or nystagmus noted. Hearing is grossly intact. Face is symmetric with normal facial animation. Speech is clear with no dysarthria noted. There is no hypophonia. There is no lip, neck/head, jaw or voice tremor. Neck is supple with full range, mild right neck tilt noted.  He is not aware of it and it is not a new issue, does not bother him.  He has no carotid bruits.  Facial sensation intact to light touch, temperature, vibration and pinprick.   Chest: Clear to auscultation without wheezing, rhonchi or crackles noted.  Heart: S1+S2+0, regular and normal without murmurs, rubs or gallops noted.   Abdomen: Soft, non-tender and non-distended.  Extremities: There is no pitting edema in the distal lower extremities bilaterally.   Skin: Warm and dry without trophic changes noted.   Musculoskeletal: exam reveals no obvious joint deformities, tenderness or joint swelling or erythema.   Neurologically:  Mental status: The patient is awake, alert and oriented in all 4 spheres. His immediate and remote  memory, attention, language skills and fund of knowledge are appropriate. There is no evidence of aphasia, agnosia, apraxia or anomia. Speech is clear with normal prosody and enunciation. Thought process is linear. Mood is normal and affect is normal.  Cranial nerves II - XII are as described above under HEENT exam.  Motor exam: Normal bulk, strength and tone is noted. There is no resting tremor, he has a mild bilateral upper extremity postural tremor. Romberg is negative, with the exception of slight sway. Reflexes are 2+ throughout, including ankles, toes are downgoing bilaterally. Fine motor skills and coordination: grossly intact.  In particular, normal finger taps and hand movements, normal rapid alternating patting bilaterally.  Normal foot taps. Cerebellar testing: No dysmetria or intention tremor. There is no truncal or gait ataxia.  Normal finger-to-nose and normal heel-to-shin bilaterally. Sensory exam: intact to light touch, temperature, vibration and pinprick sensation in the upper and lower extremities.   Gait, station and balance: He stands easily. No veering to one side is noted.  Mild right neck tilt noted.  Walks without difficulty.  Preserved arm swing.  Tandem walk is challenging for him.    Assessment and Plan:  In summary, NINA HOAR is a very pleasant 71 y.o.-year old male with an underlying medical history of diverticulosis, sleep apnea (? By chart review), tremor, bipolar disorder, and overweight state, who presents for evaluation of an episode of transient neurological symptoms including incoordination, left-sided numbness, and slurring of speech lasting for few minutes.  Symptoms could reflect a recent TIA but he has very little risk factors.  He has been started on a baby aspirin and is encouraged to continue.  We talked about secondary prevention.  He had a negative MRI recently and is largely reassured, nevertheless, we talked about maintaining a healthy lifestyle, and  taking care of risk factors.  If he has not had a recent cholesterol check, please evaluate his lipid panel and also thyroid function.  I recommend he continue with his healthy lifestyle and incorporating regular physical activity.  He is advised to follow-up with your office on a regular basis, we will do 2 additional tests including a carotid Doppler ultrasound and echocardiogram for completion.  So long as his test results are benign, we will call him with his results and he can follow-up with you as scheduled/planned.  He is advised that his medications can affect his balance, particularly his Lamictal, lithium, Mirapex, trazodone and Xanax, potentially also in combination.  He is advised to be mindful of this.  I answered all his questions today and he was in agreement with our plan.   Thank you very much for allowing me to participate in the care of this nice patient. If I can be of any further assistance to you please do not hesitate to call me at [phone].  Sincerely,   Star Age, MD, PhD

## 2021-11-29 NOTE — Patient Instructions (Addendum)
It was nice to meet you.  I am glad to hear that you have felt stable.  Please try to exercise regularly and take your medications as directed. As discussed, secondary prevention is key after a stroke, TIA (ministroke) or even after a suspected TIA. It is not full clear if you had a TIA, nevertheless, I recommend the following:  taking care of blood sugar values or diabetes management (A1c goal of less than 7.0), good blood pressure (hypertension) control and optimizing cholesterol management (with LDL goal of less than 70), exercising daily or regularly within your own mobility limitations of course, and overall cardiovascular risk factor reduction, which includes screening for and treatment of obstructive sleep apnea (OSA) and weight management.     We will do a echocardiogram, which is an ultrasound of the heart. We will call you with the results.  We will do a repeat carotid Doppler ultrasound as well. So long as your test results are benign, you can follow up with Dr. Marisue Humble.

## 2021-12-05 ENCOUNTER — Other Ambulatory Visit: Payer: Self-pay | Admitting: Neurology

## 2021-12-05 ENCOUNTER — Ambulatory Visit: Payer: Medicare Other | Admitting: Psychiatry

## 2021-12-05 ENCOUNTER — Ambulatory Visit (HOSPITAL_COMMUNITY)
Admission: RE | Admit: 2021-12-05 | Discharge: 2021-12-05 | Disposition: A | Discharge status: Home / Self Care | Payer: Medicare Other | Source: Ambulatory Visit | Attending: Neurology | Admitting: Neurology

## 2021-12-05 ENCOUNTER — Telehealth: Payer: Self-pay | Admitting: Neurology

## 2021-12-05 DIAGNOSIS — G459 Transient cerebral ischemic attack, unspecified: Secondary | ICD-10-CM

## 2021-12-05 DIAGNOSIS — R29818 Other symptoms and signs involving the nervous system: Secondary | ICD-10-CM | POA: Diagnosis not present

## 2021-12-05 DIAGNOSIS — I517 Cardiomegaly: Secondary | ICD-10-CM | POA: Insufficient documentation

## 2021-12-05 DIAGNOSIS — G4730 Sleep apnea, unspecified: Secondary | ICD-10-CM | POA: Insufficient documentation

## 2021-12-05 LAB — ECHOCARDIOGRAM COMPLETE
AR max vel: 2.85 cm2
AV Peak grad: 5.1 mmHg
Ao pk vel: 1.13 m/s
Area-P 1/2: 2.77 cm2
S' Lateral: 2.8 cm

## 2021-12-05 NOTE — Telephone Encounter (Signed)
Pt has called re: Carotid scan needing to be scheduled, please respond

## 2021-12-05 NOTE — Telephone Encounter (Signed)
I sent a message to Butch Penny at [facility] to call and schedule patient

## 2021-12-06 ENCOUNTER — Telehealth: Payer: Self-pay | Admitting: *Deleted

## 2021-12-06 NOTE — Telephone Encounter (Signed)
-----   Message from Star Age, MD sent at 12/06/2021  7:54 AM EDT ----- Echocardiogram results were reviewed; overall good pump function and no sinister findings, reassuring, no further action required, please call patient to notify.  Star Age, MD, PhD [facility])

## 2021-12-06 NOTE — Telephone Encounter (Signed)
Spoke to patient gave results from Echocardiogram . Pt expressed understanding and thanked me for calling

## 2021-12-07 ENCOUNTER — Other Ambulatory Visit: Payer: Self-pay | Admitting: Psychiatry

## 2021-12-07 DIAGNOSIS — F3130 Bipolar disorder, current episode depressed, mild or moderate severity, unspecified: Secondary | ICD-10-CM

## 2021-12-10 ENCOUNTER — Ambulatory Visit (HOSPITAL_COMMUNITY)
Admission: RE | Admit: 2021-12-10 | Discharge: 2021-12-10 | Disposition: A | Discharge status: Home / Self Care | Payer: Medicare Other | Source: Ambulatory Visit | Attending: Neurology | Admitting: Neurology

## 2021-12-10 DIAGNOSIS — R29818 Other symptoms and signs involving the nervous system: Secondary | ICD-10-CM | POA: Diagnosis not present

## 2021-12-10 NOTE — Progress Notes (Signed)
Bilateral carotid duplex study completed. Please see CV Proc for preliminary results.  Garan Frappier BS, RVT 12/10/2021 9:27 AM

## 2021-12-13 ENCOUNTER — Telehealth: Payer: Self-pay | Admitting: *Deleted

## 2021-12-13 NOTE — Telephone Encounter (Signed)
I called pt and relayed the results of carotid doppler/ultrasound did not show any significant narrowing of vessels.  No further action at this time.  As discussed may return to pcp for follow up/ as scheduled . Pt verbalized understanding of results.

## 2021-12-13 NOTE — Telephone Encounter (Signed)
-----   Message from Star Age, MD sent at 12/13/2021  7:45 AM EDT ----- Please advise patient that the recent carotid Doppler study which is the ultrasound of the main neck arteries, was negative for any significant stenosis, that is tightness/blockage of the arteries. No further action is required at this time of this test.  As discussed, patient can follow-up with his primary care as planned/scheduled.  Star Age, MD, PhD [facility])

## 2022-01-06 ENCOUNTER — Other Ambulatory Visit: Payer: Self-pay | Admitting: Psychiatry

## 2022-01-06 DIAGNOSIS — F5105 Insomnia due to other mental disorder: Secondary | ICD-10-CM

## 2022-01-06 DIAGNOSIS — F3130 Bipolar disorder, current episode depressed, mild or moderate severity, unspecified: Secondary | ICD-10-CM

## 2022-01-07 ENCOUNTER — Encounter: Payer: Self-pay | Admitting: Psychiatry

## 2022-01-07 ENCOUNTER — Ambulatory Visit (INDEPENDENT_AMBULATORY_CARE_PROVIDER_SITE_OTHER): Payer: Medicare Other | Admitting: Psychiatry

## 2022-01-07 DIAGNOSIS — F3130 Bipolar disorder, current episode depressed, mild or moderate severity, unspecified: Secondary | ICD-10-CM | POA: Diagnosis not present

## 2022-01-07 DIAGNOSIS — G251 Drug-induced tremor: Secondary | ICD-10-CM

## 2022-01-07 DIAGNOSIS — F5105 Insomnia due to other mental disorder: Secondary | ICD-10-CM

## 2022-01-07 DIAGNOSIS — Z79899 Other long term (current) drug therapy: Secondary | ICD-10-CM | POA: Diagnosis not present

## 2022-01-07 MED ORDER — LITHIUM CARBONATE 300 MG PO CAPS: ORAL_CAPSULE | ORAL | 0 refills | Status: DC

## 2022-01-07 MED ORDER — LITHIUM CARBONATE 150 MG PO CAPS: ORAL_CAPSULE | ORAL | 0 refills | Status: DC

## 2022-01-07 NOTE — Progress Notes (Signed)
Drew Padilla [id number] 03/18/1951 71 y.o.  Subjective:   Drew Padilla ID:  Drew Padilla is a 71 y.o. (DOB May 26, 1951) male.  Chief Complaint:  Chief Complaint  Drew Padilla presents with   Follow-up    Bipolar I disorder, most recent episode depressed [facility])   Depression   Anxiety    HPI  Drew Padilla presents to the office today for follow-up of bipolar and insomnia.  seen in January 2021.  Good physical.  No otgher meds except Rx here..  12/2019 appointment without medicine changes in the following noted: Best I can.   Stress wife's health and constant caretaking.  Taking more and more out of hime and feels sort of guilty about it.    Aggressive RA is her problem and hip replacement January 2020 without help and complications.  Drew Padilla's doing everything.  [place]aring on him emotionally.  Not like retirement expected.  Staying in.  W gradual deterioration. Xanax a couple times of week.  Propranolol prn tremor. Minimal tremor. Sleep good.  10/31/2020 appointment with the following noted: Last 2 mos been the darkest bleakest in years.  Doesn't know why.  More depressed.  No energy or motivation for normal activities.  Anhedonia.  Sleep unchanged but may be more naps daytime and still 7-8 hours at night.  No particular trigger except wife's health continues to worsen and Drew Padilla has to do more.  Occ break briefly  Does not leave for extended periods. . No SE lithium generally. Plan:Labs from 12/2019 were normal including low normal lithium level 0.5  Due to relapse repeat labs. Increase pramipexole to 0.375 mg BID for off label depression.  Call in 2 weeks to report effect.  10/31/20 Note on labs and meds: Lithium level has dropped below the therapeutic range to 0.4 on the same dose of 5 of the 150 mg capsules.  Drew Padilla is also relapsed into depression. At the last appointment we increased the pramipexole because of his depression.  If Drew Padilla is better then Drew Padilla does not need to change the lithium.  If his  depression is not better then Drew Padilla needs to increase the lithium to 6 of the 150 mg capsules or we can prescribe 3 of the 300 mg capsules to try to get his lithium level up.  12/25/20 MD noted: Drew Padilla has a history of treatment resistant major depression which has largely been under control over the last several years with a combination of lithium, lamotrigine and pramipexole.  At his last appointment in May Drew Padilla had relapsed into depression. We had him increase pramipexole to 0.375 mg twice daily which Drew Padilla calls and reports has not been particularly helpful. We ordered a lithium level was which was low at 0.4 and therefore it was suggested that Drew Padilla increase the lithium but Drew Padilla has been reluctant to do so due to lithium related tremors.  In terms of getting relief from depression, still the best option is to increase the lithium but I understand that will result in more tremors to be managed by propranolol or perhaps some other tremor medicine.   It is not obvious regarding the next step as there are many different options.  I do not like starting a new medication without an appointment so this put him on the cancellation list. In the meantime Drew Padilla can increase pramipexole 1 step further to 0.5 mg twice daily.  As we go higher it might cause some sleepiness and if that is a problem then let us know.  04/02/2021 appointment with the following noted: Doing some better.  Dep can comes in waves and last 1-2 days wihtout pattern nor trigger. Lithium 900 made tremors constant.  Lithium 750 tolerable. Increase pramipexole to 0.5 BID definitely helped. No SE. Stress wife's health. Using propranolol 20-40 prn tremor. Asked about CBD. Plan: Lithium level too low lately at 0.4 on 750 mg daily Increase lithium again to 900/750 QOD Increase propranolol to 40 mg BID prn tremor.  Drew Padilla usually takes just 20 Continue pramipexole to 0.5 mg BID for off label depression.    07/04/2021 appt noted: Doing OK.  Not  consistently  depressed. Did increase lithium. Wife's health still bad and constant stress with multiple issues.  Multiple hip surgeries and dislocations.  Bulk of time is caring for her.  No real help with her.  No family here but some around.  Drew Padilla doesn't ask for it.  W has RA. Plan no med changes: continue lithium again to 900/750 QOD Tremor better with  propranolol to 40 mg BID  Continue pramipexole to 0.5 mg BID for off label depression.   Trazodone 200 mg HS, Xanax  mg prn  01/07/22 appt noted: A lot of change.  Depressed but it comes and goes without pattern.  Can get up feeling good and then crash.  May last a few days, not weeks.  Maybe situational but ongoing with wife and not likely to change. Wife hip replacement for RA.    It has been a disaster with 4 revisions.  Drew Padilla does everything at home.  She's been to rehab and got pressure sore. Trazodone works from 11-2 and awakens and may not get back to sleep 1/2 th time. TIA 1 month ago and work up negative.  SX with balance px and diff speaking fluently and numb left side.     Drew Padilla denies any recent difficulty with anxiety.    Drew Padilla reports that energy and motivation have been good.  Drew Padilla denies any difficulty with concentration.  Drew Padilla denies any suicidal ideation.  Drew Padilla works all day.  Psych med history:  Ambien CR, trazodone, mirtazapine NR, Lunesta, doxepin, hydroxyzine, unisom, Belsomra, Seroquel, temazepam,  propranolol Lamotrigine, Cytomel lithium,   Pramipexole 0.5 helped Abilify 10 mg 2009 for several months Fluoxetine, citalopram 80, Wellbutrin, buspirone Adderall, Strattera, Nuvigil others also Under her psychiatric care since May 2001  Review of Systems:  Review of Systems  Cardiovascular:  Negative for chest pain and palpitations.  Gastrointestinal:  Negative for diarrhea.  Endocrine: Negative for polyuria.  Neurological:  Negative for tremors and weakness.  Psychiatric/Behavioral:  Positive for dysphoric mood. Negative  for agitation, behavioral problems, confusion, decreased concentration, hallucinations, self-injury, sleep disturbance and suicidal ideas. The Drew Padilla is not nervous/anxious and is not hyperactive.     Medications: I have reviewed the Drew Padilla's current medications.  Current Outpatient Medications  Medication Sig Dispense Refill   ALPRAZolam (XANAX) 0.5 MG tablet TAKE 1 TABLET BY MOUTH 2 TIMES DAILY AS NEEDED FOR ANXIETY. 45 tablet 3   aspirin EC 81 MG tablet Take 81 mg by mouth daily. Swallow whole.     lamoTRIgine (LAMICTAL) 200 MG tablet Take 1 tablet (200 mg total) by mouth 2 (two) times daily. 180 tablet 1   liothyronine (CYTOMEL) 25 MCG tablet TAKE 1 AND 1/2 TABLETS(37.5 MCG) BY MOUTH DAILY 135 tablet 1   pramipexole (MIRAPEX) 0.5 MG tablet Take 1 tablet (0.5 mg total) by mouth 2 (two) times daily. 180 tablet 1   propranolol (INDERAL) 40  MG tablet Take 1 tablet (40 mg total) by mouth 2 (two) times daily as needed (tremor). 180 tablet 1   Pyridoxine HCl (VITAMIN B-6) 500 MG tablet Take 500 mg by mouth 2 (two) times a day.     traZODone (DESYREL) 100 MG tablet TAKE 2 TABLETS BY MOUTH AT BEDTIME 180 tablet 1   lithium carbonate 150 MG capsule Take 5 capsules every other day alternating with lithium '600mg'$  450 capsule 0   lithium carbonate 300 MG capsule 2 capsules every other day 270 capsule 0   No current facility-administered medications for this visit.    Medication Side Effects: Other: tremor managed with B6  Allergies: No Known Allergies  Past Medical History:  Diagnosis Date   Adenomatous colon polyp 2003   on 2003 colonoscopy (and on 2013 colon; numerous; next 01/2015)   Bipolar disorder ([place])    with primary depressive symptoms   Chronic headaches    Diverticulosis 2003   on colonoscopy   Melanoma ([place]) 1990   R arm   Obstructive sleep apnea    Syncope 11/16/2015   Tremor     Family History  Problem Relation Age of Onset   Hypertension Father    Sleep apnea Father     Coronary artery disease Father    Diabetes Father     Social History   Socioeconomic History   Marital status: Married    Spouse name: Debbie   Number of children: Not on file   Years of education: Not on file   Highest education level: Not on file  Occupational History   Occupation: funeral service    Employer: [employer]  Tobacco Use   Smoking status: Former    Types: Cigarettes    Quit date: 1990    Years since quitting: 33.5   Smokeless tobacco: Never  Substance and Sexual Activity   Alcohol use: No   Drug use: No   Sexual activity: Not on file  Other Topics Concern   Not on file  Social History Narrative   Caffeine: 1 serving daily   Social Determinants of Health   Financial Resource Strain: Not on file  Food Insecurity: Not on file  Transportation Needs: Not on file  Physical Activity: Not on file  Stress: Not on file  Social Connections: Not on file  Intimate Partner Violence: Not on file    Past Medical History, Surgical history, Social history, and Family history were reviewed and updated as appropriate.   Please see review of systems for further details on the Drew Padilla's review from today.   Objective:   Physical Exam:  There were no vitals taken for this visit.  Physical Exam Constitutional:      General: Drew Padilla is not in acute distress.    Appearance: Drew Padilla is well-developed.  Musculoskeletal:        General: No deformity.  Neurological:     Mental Status: Drew Padilla is alert and oriented to person, place, and time.     Motor: No tremor.     Coordination: Coordination normal.     Gait: Gait normal.  Psychiatric:        Attention and Perception: Attention normal. Drew Padilla is attentive.        Mood and Affect: Mood is depressed. Mood is not anxious. Affect is not labile, blunt, angry, tearful or inappropriate.        Speech: Speech normal.        Behavior: Behavior normal.  Thought Content: Thought content normal. Thought content is not delusional.  Thought content does not include homicidal or suicidal ideation. Thought content does not include suicidal plan.        Cognition and Memory: Cognition normal.        Judgment: Judgment normal.     Comments: Insight is good. Dep in brief bouts worse. Tired.     Lab Review:     Component Value Date/Time   NA 138 11/27/2020 1331   K 4.2 11/27/2020 1331   CL 106 11/27/2020 1331   CO2 25 11/27/2020 1331   GLUCOSE 77 11/27/2020 1331   BUN 11 11/27/2020 1331   CREATININE 1.01 11/27/2020 1331   CALCIUM 9.5 11/27/2020 1331   PROT 5.6 (L) 12/12/2015 0702   ALBUMIN 3.8 12/12/2015 0702   AST 18 12/12/2015 0702   ALT 15 (L) 12/12/2015 0702   ALKPHOS 45 12/12/2015 0702   BILITOT 0.8 12/12/2015 0702   GFRNONAA 60 (L) 12/12/2015 0702   GFRAA >60 12/12/2015 0702       Component Value Date/Time   WBC 6.7 12/12/2015 0702   RBC 4.65 12/12/2015 0702   HGB 13.8 12/12/2015 0702   HCT 40.8 12/12/2015 0702   PLT 120 (L) 12/12/2015 0702   MCV 87.7 12/12/2015 0702   MCH 29.7 12/12/2015 0702   MCHC 33.8 12/12/2015 0702   RDW 12.7 12/12/2015 0702   LYMPHSABS 1.3 12/11/2015 1348   MONOABS 0.4 12/11/2015 1348   EOSABS 0.1 12/11/2015 1348   BASOSABS 0.0 12/11/2015 1348    Lithium Lvl  Date Value Ref Range Status  11/27/2020 0.4 (L) 0.6 - 1.2 mmol/L Final   10/31/20 Note on labs and meds: Lithium level has dropped below the therapeutic range to 0.4 on the same dose of 5 of the 150 mg capsules.   No results found for: "PHENYTOIN", "PHENOBARB", "VALPROATE", "CBMZ"   .res Assessment: Plan:    Drew Padilla was seen today for follow-up, depression and anxiety.  Diagnoses and all orders for this visit:  Bipolar I disorder, most recent episode depressed ([facility]) -     lithium carbonate 300 MG capsule; 2 capsules every other day -     lithium carbonate 150 MG capsule; Take 5 capsules every other day alternating with lithium '600mg'$   Insomnia due to mental condition  Lithium-induced tremor  Lithium  use   Relapse depression partially responsive to pramipexole increase Doesn't tolerate lithium 900 dT tremor.  Supportive therapy on importance self care and getting out for himself.  Disc his guilt over conflicted feelings in detail. Disc longterm plans.  Disc care-giving stress and need for respite at times.    Mood less depressed markedly like in the past.  caretaking fatigue.  Emphasized getting breaks from this for his own physical and mental well-being.  Counseled Drew Padilla regarding potential benefits, risks, and side effects of lithium to include potential risk of lithium affecting thyroid and renal function.  Discussed need for periodic lab monitoring to determine drug level and to assess for potential adverse effects.  Counseled Drew Padilla regarding signs and symptoms of lithium toxicity and advised that they notify office immediately or seek urgent medical attention if experiencing these signs and symptoms.  Drew Padilla advised to contact office with any questions or concerns. Lithium level too low lately at 0.4 on 750 mg daily continue lithium again to 900/750 QOD Tremor better with  propranolol to 40 mg BID   Continue pramipexole to 0.5 mg BID for off label depression.  Start Trintellix 5 then 10 mg daily. Disc SE.  Failed other safest bipolar dep meds except consider Vraylar.  (Cost may be aproblem with it).  Consider Auvelity If it works then reduce paramipesole to 0.25 mg BID and plan to stop  FU 2 mos  Lynder Parents, MD, DFAPA   Please see After Visit Summary for Drew Padilla specific instructions.  No future appointments.   No orders of the defined types were placed in this encounter.      -------------------------------

## 2022-01-08 ENCOUNTER — Telehealth: Payer: Self-pay | Admitting: Psychiatry

## 2022-01-08 NOTE — Telephone Encounter (Signed)
Pt called inquiring on sleep aid. Provider was to send in Rx for sleep aid and not increase Trazodone. Contact pt @ [phone].

## 2022-01-08 NOTE — Telephone Encounter (Signed)
Please see note from patient in regards to insomnia.

## 2022-01-09 ENCOUNTER — Telehealth: Payer: Self-pay | Admitting: Psychiatry

## 2022-01-09 ENCOUNTER — Other Ambulatory Visit: Payer: Self-pay | Admitting: Psychiatry

## 2022-01-09 MED ORDER — ZALEPLON 10 MG PO CAPS
ORAL_CAPSULE | ORAL | 0 refills | Status: DC
Start: 2022-01-09 — End: 2022-05-07

## 2022-01-09 NOTE — Telephone Encounter (Signed)
LVM to RC 

## 2022-01-09 NOTE — Telephone Encounter (Signed)
Notified patient of recommendations and that Rx had been sent.

## 2022-01-09 NOTE — Telephone Encounter (Signed)
He is right about the sleep aid.  We had discussed the use of zaleplon to take in the middle of the night when he awakens and apparently I did not send that prescription in.  I will send it in at this time.  Please make sure that when he takes the Trintellix he is taking the 5 mg tablets because I gave him both 5 and 10 mg samples.  Have him try it at his evening meal.  If it still causes unmanageable nausea and certainly if it is causing vomiting calls [place] back because we will have to stop it and do something else instead

## 2022-01-09 NOTE — Telephone Encounter (Signed)
Ronalee Belts called today at 9:30 to inquire about the sleep aid you mentioned to him.  He wakes up in the middle of the night and can't go back to sleep.  You mentioned a medication that can help.  He does not remember the name of the medication, but a prescription has not been sent to the pharmacy. What is the status of this medication?  Also, he reported that he is not tolerating the trintellix.  It causes immediate nausea. He has stopped taking.  Appt 03/21/22.  [facility] [address].

## 2022-01-14 ENCOUNTER — Telehealth: Payer: Self-pay

## 2022-01-14 NOTE — Telephone Encounter (Signed)
Noted Trintellix not tolerated.  Stop it.  Will use Auvelity as next antidepressant as it has minimal risk of nausea.  Wait until N resolves then get samples Auvelity and start 1 in AM for 1 week then 1 twice daily. It has a 15% risk of dizziness.  Let us know if any problems.

## 2022-01-15 NOTE — Telephone Encounter (Signed)
Patient notified. Samples pulled.

## 2022-01-17 NOTE — Telephone Encounter (Signed)
Zaleplon was sent in.  Did he take it ?  What was the result?

## 2022-01-18 NOTE — Telephone Encounter (Signed)
Called patient. He is sleeping well.

## 2022-01-31 ENCOUNTER — Other Ambulatory Visit: Payer: Self-pay | Admitting: Psychiatry

## 2022-01-31 DIAGNOSIS — F3130 Bipolar disorder, current episode depressed, mild or moderate severity, unspecified: Secondary | ICD-10-CM

## 2022-02-04 ENCOUNTER — Telehealth: Payer: Self-pay | Admitting: Psychiatry

## 2022-02-04 NOTE — Telephone Encounter (Signed)
Please review

## 2022-02-04 NOTE — Telephone Encounter (Signed)
He cannot take Auvelity anymore. Side effects- Extreme dizziness, unsteady when walking, not having effect on depression either. He had increased the dose as directed and symptoms were worse.

## 2022-02-05 NOTE — Telephone Encounter (Signed)
Ok . Just stop it

## 2022-02-06 NOTE — Telephone Encounter (Signed)
Pt informed

## 2022-03-21 ENCOUNTER — Ambulatory Visit: Payer: Medicare Other | Admitting: Psychiatry

## 2022-03-28 ENCOUNTER — Other Ambulatory Visit: Payer: Self-pay | Admitting: Psychiatry

## 2022-03-28 DIAGNOSIS — G251 Drug-induced tremor: Secondary | ICD-10-CM

## 2022-03-29 ENCOUNTER — Other Ambulatory Visit: Payer: Self-pay | Admitting: Psychiatry

## 2022-03-29 DIAGNOSIS — F3130 Bipolar disorder, current episode depressed, mild or moderate severity, unspecified: Secondary | ICD-10-CM

## 2022-03-30 ENCOUNTER — Other Ambulatory Visit: Payer: Self-pay | Admitting: Psychiatry

## 2022-03-30 DIAGNOSIS — F5105 Insomnia due to other mental disorder: Secondary | ICD-10-CM

## 2022-03-30 DIAGNOSIS — F411 Generalized anxiety disorder: Secondary | ICD-10-CM

## 2022-04-06 ENCOUNTER — Other Ambulatory Visit: Payer: Self-pay | Admitting: Psychiatry

## 2022-04-06 DIAGNOSIS — F3130 Bipolar disorder, current episode depressed, mild or moderate severity, unspecified: Secondary | ICD-10-CM

## 2022-04-24 ENCOUNTER — Ambulatory Visit (INDEPENDENT_AMBULATORY_CARE_PROVIDER_SITE_OTHER): Payer: Medicare Other | Admitting: Psychiatry

## 2022-04-24 ENCOUNTER — Encounter: Payer: Self-pay | Admitting: Psychiatry

## 2022-04-24 DIAGNOSIS — F5105 Insomnia due to other mental disorder: Secondary | ICD-10-CM

## 2022-04-24 DIAGNOSIS — G251 Drug-induced tremor: Secondary | ICD-10-CM | POA: Diagnosis not present

## 2022-04-24 DIAGNOSIS — Z79899 Other long term (current) drug therapy: Secondary | ICD-10-CM

## 2022-04-24 DIAGNOSIS — F3130 Bipolar disorder, current episode depressed, mild or moderate severity, unspecified: Secondary | ICD-10-CM

## 2022-04-24 NOTE — Patient Instructions (Signed)
Start Vraylar 1 capsule every other day Reduce pramipexole to 1/2 twice daily for 1 week then 1/2 in the AM for 1 week, then stop it.

## 2022-04-24 NOTE — Progress Notes (Signed)
PROSPER PAFF [id number] 1951/04/24 71 y.o.  Subjective:   [name] is a 71 y.o. (DOB [birth year]) male.  Chief Complaint:  Chief Complaint  Patient presents with   Follow-up    Bipolar I disorder, most recent episode depressed ([place])   Depression   Medication Problem    HPI  Drew Padilla presents to the office today for follow-up of bipolar and insomnia.  seen in January 2021.  Good physical.  No otgher meds except Rx here..  12/2019 appointment without medicine changes in the following noted: Best I can.   Stress wife's health and constant caretaking.  Taking more and more out of hime and feels sort of guilty about it.    Aggressive RA is her problem and hip replacement January 2020 without help and complications.  He's doing everything.  [place]aring on him emotionally.  Not like retirement expected.  Staying in.  W gradual deterioration. Xanax a couple times of week.  Propranolol prn tremor. Minimal tremor. Sleep good.  10/31/2020 appointment with the following noted: Last 2 mos been the darkest bleakest in years.  Doesn't know why.  More depressed.  No energy or motivation for normal activities.  Anhedonia.  Sleep unchanged but may be more naps daytime and still 7-8 hours at night.  No particular trigger except wife's health continues to worsen and he has to do more.  Occ break briefly  Does not leave for extended periods. . No SE lithium generally. Plan:Labs from 12/2019 were normal including low normal lithium level 0.5  Due to relapse repeat labs. Increase pramipexole to 0.375 mg BID for off label depression.  Call in 2 weeks to report effect.  10/31/20 Note on labs and meds: Lithium level has dropped below the therapeutic range to 0.4 on the same dose of 5 of the 150 mg capsules.  He is also relapsed into depression. At the last appointment we increased the pramipexole because of his depression.  If he is better then he does not need to change the lithium.   If his depression is not better then he needs to increase the lithium to 6 of the 150 mg capsules or we can prescribe 3 of the 300 mg capsules to try to get his lithium level up.  12/25/20 MD noted: Patient has a history of treatment resistant major depression which has largely been under control over the last several years with a combination of lithium, lamotrigine and pramipexole.  At his last appointment in May he had relapsed into depression. We had him increase pramipexole to 0.375 mg twice daily which he calls and reports has not been particularly helpful. We ordered a lithium level was which was low at 0.4 and therefore it was suggested that he increase the lithium but he has been reluctant to do so due to lithium related tremors.  In terms of getting relief from depression, still the best option is to increase the lithium but I understand that will result in more tremors to be managed by propranolol or perhaps some other tremor medicine.   It is not obvious regarding the next step as there are many different options.  I do not like starting a new medication without an appointment so this put him on the cancellation list. In the meantime he can increase pramipexole 1 step further to 0.5 mg twice daily.  As we go higher it might cause some sleepiness and if that is a problem then let us know.  04/02/2021 appointment with the following noted: Doing some better.  Dep can comes in waves and last 1-2 days wihtout pattern nor trigger. Lithium 900 made tremors constant.  Lithium 750 tolerable. Increase pramipexole to 0.5 BID definitely helped. No SE. Stress wife's health. Using propranolol 20-40 prn tremor. Asked about CBD. Plan: Lithium level too low lately at 0.4 on 750 mg daily Increase lithium again to 900/750 QOD Increase propranolol to 40 mg BID prn tremor.  He usually takes just 20 Continue pramipexole to 0.5 mg BID for off label depression.    07/04/2021 appt noted: Doing OK.  Not   consistently depressed. Did increase lithium. Wife's health still bad and constant stress with multiple issues.  Multiple hip surgeries and dislocations.  Bulk of time is caring for her.  No real help with her.  No family here but some around.  He doesn't ask for it.  W has RA. Plan no med changes: continue lithium again to 900/750 QOD Tremor better with  propranolol to 40 mg BID  Continue pramipexole to 0.5 mg BID for off label depression.   Trazodone 200 mg HS, Xanax  mg prn  01/07/22 appt noted: A lot of change.  Depressed but it comes and goes without pattern.  Can get up feeling good and then crash.  May last a few days, not weeks.  Maybe situational but ongoing with wife and not likely to change. Wife hip replacement for RA.    It has been a disaster with 4 revisions.  He does everything at home.  She's been to rehab and got pressure sore. Trazodone works from 11-2 and awakens and may not get back to sleep 1/2 th time. TIA 1 month ago and work up negative.  SX with balance px and diff speaking fluently and numb left side.   Plan: Start Trintellix 5 then 10 mg daily.  01/09/22 called complaining of N. Encouraged to give it more time. 01/14/22 TC: N intolerable  with Trintellix and stopped it.  Plan start [place] when N resolves. 02/04/22 TC:  He cannot take Auvelity anymore. Side effects- Extreme dizziness, unsteady when walking, not having effect on depression either. He had increased the dose as directed and symptoms were worse.     04/24/22 appt noted: Current psychiatric meds include alprazolam 0.5 mg twice daily as needed anxiety, lamotrigine 200 mg twice daily, Cytomel 37.5 mg every morning, lithium 750 mg nightly, pramipexole 0.5 mg twice daily, propranolol 40 mg twice daily as needed tremor, trazodone 200 mg nightly, Sonata 10 mg as needed insomnia Depression about the same with >50% of the time depressed.  Fights it daily with some days better than others.  Can be having a good day  at times and then drop like a rock.  Maybe stress with home situation is a trigger. Good bit of anxiety tgool.  Infrequent Xanax.  Awake at 2 AM and often for the balance of the night.  May have to get up with wife at night.   Avg 6 hours.  No intentional nap but may doze in recliner. Asks about [place].  Son in law good response.  Psych med history:  Ambien CR, trazodone, mirtazapine NR, Lunesta, doxepin, hydroxyzine, unisom, Belsomra, Seroquel, temazepam,  sonata works. propranolol Lamotrigine, Cytomel lithium,   Pramipexole 0.5 helped Abilify 10 mg 2009 for several months  Fluoxetine, citalopram 80, Wellbutrin, buspirone Trintellix a week with N at 5 Auvelity 2-3 weeks with dizziness, unsteady  Adderall, Strattera, Nuvigil others also Under  her psychiatric care since May 2001  Review of Systems:  Review of Systems  Constitutional:  Positive for fatigue.  Cardiovascular:  Negative for chest pain and palpitations.  Gastrointestinal:  Negative for diarrhea.  Endocrine: Negative for polyuria.  Neurological:  Negative for tremors and weakness.  Psychiatric/Behavioral:  Positive for dysphoric mood. Negative for agitation, behavioral problems, confusion, decreased concentration, hallucinations, self-injury, sleep disturbance and suicidal ideas. The patient is not nervous/anxious and is not hyperactive.     Medications: I have reviewed the patient's current medications.  Current Outpatient Medications  Medication Sig Dispense Refill   ALPRAZolam (XANAX) 0.5 MG tablet TAKE 1 TABLET BY MOUTH TWICE A DAY AS NEEDED FOR ANXIETY 45 tablet 0   aspirin EC 81 MG tablet Take 81 mg by mouth daily. Swallow whole.     lamoTRIgine (LAMICTAL) 200 MG tablet TAKE 1 TABLET BY MOUTH TWICE A DAY 180 tablet 0   liothyronine (CYTOMEL) 25 MCG tablet TAKE 1 AND 1/2 TABLETS(37.5 MCG) BY MOUTH DAILY 135 tablet 0   lithium carbonate 150 MG capsule TAKE 5 CAPSULES DAILY 450 capsule 0   lithium carbonate 300 MG  capsule 2 capsules every other day 270 capsule 0   pramipexole (MIRAPEX) 0.5 MG tablet TAKE 1 TABLET BY MOUTH TWICE A DAY 180 tablet 0   propranolol (INDERAL) 40 MG tablet TAKE 1 TABLET (40 MG TOTAL) BY MOUTH 2 (TWO) TIMES DAILY AS NEEDED (TREMOR). 180 tablet 1   Pyridoxine HCl (VITAMIN B-6) 500 MG tablet Take 500 mg by mouth 2 (two) times a day.     traZODone (DESYREL) 100 MG tablet TAKE 2 TABLETS BY MOUTH AT BEDTIME 180 tablet 0   zaleplon (SONATA) 10 MG capsule 1 at night if he awakens and still has 4 hours left to sleep 30 capsule 0   No current facility-administered medications for this visit.    Medication Side Effects: Other: tremor managed with B6  Allergies: No Known Allergies  Past Medical History:  Diagnosis Date   Adenomatous colon polyp 2003   on 2003 colonoscopy (and on 2013 colon; numerous; next 01/2015)   Bipolar disorder ([place])    with primary depressive symptoms   Chronic headaches    Diverticulosis 2003   on colonoscopy   Melanoma ([place]) 1990   R arm   Obstructive sleep apnea    Syncope 11/16/2015   Tremor     Family History  Problem Relation Age of Onset   Hypertension Father    Sleep apnea Father    Coronary artery disease Father    Diabetes Father     Social History   Socioeconomic History   Marital status: Married    Spouse name: Debbie   Number of children: Not on file   Years of education: Not on file   Highest education level: Not on file  Occupational History   Occupation: funeral service    Employer: [employer]  Tobacco Use   Smoking status: Former    Types: Cigarettes    Quit date: 1990    Years since quitting: 33.8   Smokeless tobacco: Never  Substance and Sexual Activity   Alcohol use: No   Drug use: No   Sexual activity: Not on file  Other Topics Concern   Not on file  Social History Narrative   Caffeine: 1 serving daily   Social Determinants of Health   Financial Resource Strain: Not on file  Food Insecurity: Not on  file  Transportation Needs: Not on  file  Physical Activity: Not on file  Stress: Not on file  Social Connections: Not on file  Intimate Partner Violence: Not on file    Past Medical History, Surgical history, Social history, and Family history were reviewed and updated as appropriate.   Please see review of systems for further details on the patient's review from today.   Objective:   Physical Exam:  There were no vitals taken for this visit.  Physical Exam Constitutional:      General: He is not in acute distress.    Appearance: He is well-developed.  Musculoskeletal:        General: No deformity.  Neurological:     Mental Status: He is alert and oriented to person, place, and time.     Motor: No tremor.     Coordination: Coordination normal.     Gait: Gait normal.  Psychiatric:        Attention and Perception: Attention normal. He is attentive.        Mood and Affect: Mood is depressed. Mood is not anxious. Affect is not labile, blunt, angry, tearful or inappropriate.        Speech: Speech normal.        Behavior: Behavior normal.        Thought Content: Thought content normal. Thought content is not delusional. Thought content does not include homicidal or suicidal ideation. Thought content does not include suicidal plan.        Cognition and Memory: Cognition normal.        Judgment: Judgment normal.     Comments: Insight is good. Dep most of the time Tired.     Lab Review:     Component Value Date/Time   NA 138 11/27/2020 1331   K 4.2 11/27/2020 1331   CL 106 11/27/2020 1331   CO2 25 11/27/2020 1331   GLUCOSE 77 11/27/2020 1331   BUN 11 11/27/2020 1331   CREATININE 1.01 11/27/2020 1331   CALCIUM 9.5 11/27/2020 1331   PROT 5.6 (L) 12/12/2015 0702   ALBUMIN 3.8 12/12/2015 0702   AST 18 12/12/2015 0702   ALT 15 (L) 12/12/2015 0702   ALKPHOS 45 12/12/2015 0702   BILITOT 0.8 12/12/2015 0702   GFRNONAA 60 (L) 12/12/2015 0702   GFRAA >60 12/12/2015 0702        Component Value Date/Time   WBC 6.7 12/12/2015 0702   RBC 4.65 12/12/2015 0702   HGB 13.8 12/12/2015 0702   HCT 40.8 12/12/2015 0702   PLT 120 (L) 12/12/2015 0702   MCV 87.7 12/12/2015 0702   MCH 29.7 12/12/2015 0702   MCHC 33.8 12/12/2015 0702   RDW 12.7 12/12/2015 0702   LYMPHSABS 1.3 12/11/2015 1348   MONOABS 0.4 12/11/2015 1348   EOSABS 0.1 12/11/2015 1348   BASOSABS 0.0 12/11/2015 1348    Lithium Lvl  Date Value Ref Range Status  11/27/2020 0.4 (L) 0.6 - 1.2 mmol/L Final   10/31/20 Note on labs and meds: Lithium level has dropped below the therapeutic range to 0.4 on the same dose of 5 of the 150 mg capsules.   No results found for: "PHENYTOIN", "PHENOBARB", "VALPROATE", "CBMZ"   .res Assessment: Plan:    Fuller was seen today for follow-up, depression and medication problem.  Diagnoses and all orders for this visit:  Bipolar I disorder, most recent episode depressed ([place])  Insomnia due to mental condition  Lithium-induced tremor  Lithium use   Relapse depression partially responsive to pramipexole increase Doesn't tolerate  lithium 900 dT tremor.  Supportive therapy on importance self care and getting out for himself.  Disc his guilt over conflicted feelings in detail. Disc longterm plans.  Disc care-giving stress and need for respite at times.    Mood less depressed markedly like in the past.  caretaking fatigue.  Emphasized getting breaks from this for his own physical and mental well-being.  Counseled patient regarding potential benefits, risks, and side effects of lithium to include potential risk of lithium affecting thyroid and renal function.  Discussed need for periodic lab monitoring to determine drug level and to assess for potential adverse effects.  Counseled patient regarding signs and symptoms of lithium toxicity and advised that they notify office immediately or seek urgent medical attention if experiencing these signs and symptoms.  Patient  advised to contact office with any questions or concerns. Lithium level too low lately at 0.4 on 750 mg daily continue lithium again to 900/750 QOD Tremor better with  propranolol to 40 mg BID   Wean pramipexole and start Vraylar 1.5 mg QOD.   Start Vraylar 1 capsule every other day Reduce pramipexole to 1/2 twice daily for 1 week then 1/2 in the AM for 1 week, then stop it.  Call in a couple of weeks if not better and we'll increase it.  Disc SE.  Failed other safest bipolar dep meds except consider Vraylar.  (Cost may be aproblem with it).    FU 2 mos  Lynder Parents, MD, DFAPA   Please see After Visit Summary for patient specific instructions.  No future appointments.    No orders of the defined types were placed in this encounter.      -------------------------------

## 2022-04-26 ENCOUNTER — Telehealth: Payer: Self-pay | Admitting: Psychiatry

## 2022-04-26 NOTE — Telephone Encounter (Signed)
He was checking on cost dr Drew Padilla gave him samples of the 1.5 mg on 11/8 should I send rx?He is taking every other day so should the rx be written that way and for #30?

## 2022-04-26 NOTE — Telephone Encounter (Signed)
Was an actual Rx submitted to his pharmacy or is pt looking the cost up himself? No PA has been initiated yet.

## 2022-04-26 NOTE — Telephone Encounter (Signed)
Pt has medicare please advise

## 2022-04-26 NOTE — Telephone Encounter (Signed)
Next appt is 07/10/21. Drew Padilla called and said that the Vraylar  for 30 days will cost him $800. He cannot afford this amount. Also, he was given a discount card and they told him he doesn't qualify for it. Please call him about this situation at [phone].

## 2022-04-29 DIAGNOSIS — Z961 Presence of intraocular lens: Secondary | ICD-10-CM | POA: Diagnosis not present

## 2022-04-29 DIAGNOSIS — H524 Presbyopia: Secondary | ICD-10-CM | POA: Diagnosis not present

## 2022-04-29 DIAGNOSIS — H52201 Unspecified astigmatism, right eye: Secondary | ICD-10-CM | POA: Diagnosis not present

## 2022-04-29 NOTE — Telephone Encounter (Signed)
I told him that it might not be affordable with his insurance.  However also told him we would figure out a way around that through either patient assistance or samples if necessary or some other means.  He needs to try the medicine and give it a chance and if it works we will figure out a solution to the cost.

## 2022-05-03 NOTE — Telephone Encounter (Signed)
Can we do a PA on his Vraylar? Short-term I have pulled samples for him and given him discount card in with samples.

## 2022-05-03 NOTE — Telephone Encounter (Signed)
Pt called reporting he is now taking Vraylar every day. He reports it's working, but he can not afford $800 monthly. He checked with [facility] and even higher. Pt asking if he should continue this medication? Also, concerned that he weaned off Pramipexole to start [place]. Contact pt @ [phone].  Apt 1/24

## 2022-05-06 NOTE — Telephone Encounter (Signed)
A discount card won't work if he's a medicare pt  I will check a [place] though

## 2022-05-06 NOTE — Telephone Encounter (Signed)
Patient said he had called his insurance company and the $800 was what they quoted him. I pulled him a month of samples today. He said it is working well for him. You want me to go ahead and send in Rx so we can do a PA if needed?

## 2022-05-07 ENCOUNTER — Other Ambulatory Visit: Payer: Self-pay | Admitting: Psychiatry

## 2022-05-07 DIAGNOSIS — F3130 Bipolar disorder, current episode depressed, mild or moderate severity, unspecified: Secondary | ICD-10-CM

## 2022-05-07 DIAGNOSIS — F411 Generalized anxiety disorder: Secondary | ICD-10-CM

## 2022-05-29 ENCOUNTER — Other Ambulatory Visit: Payer: Self-pay | Admitting: Psychiatry

## 2022-05-29 DIAGNOSIS — F411 Generalized anxiety disorder: Secondary | ICD-10-CM

## 2022-05-30 NOTE — Telephone Encounter (Signed)
Due 12/19

## 2022-06-03 NOTE — Telephone Encounter (Signed)
Pt called back today checking on status of script.  I advised him it should be ready tomorrow.

## 2022-06-06 ENCOUNTER — Other Ambulatory Visit: Payer: Self-pay | Admitting: Psychiatry

## 2022-06-07 NOTE — Telephone Encounter (Signed)
Filled 11/21 appt 1/24

## 2022-06-18 ENCOUNTER — Telehealth: Payer: Self-pay | Admitting: Psychiatry

## 2022-06-18 NOTE — Telephone Encounter (Signed)
Please see note from 11/10.I checked cover my meds but it does not look like a PA was done and note from 11/10 mentioned possibly pt assistance.

## 2022-06-18 NOTE — Telephone Encounter (Signed)
PA submitted for Vraylar 1.5 mg #30 but response was no PA needed. I will contact pharmacy but pt. Assistance is probably only option

## 2022-06-18 NOTE — Telephone Encounter (Signed)
Drew Padilla called and said that his samples of Drew Padilla are gone. He is checking on the status of the Vraylar costing $800 monthly which he can't afford. Please call him at [phone].

## 2022-06-18 NOTE — Telephone Encounter (Signed)
Noted I will check into this.

## 2022-06-18 NOTE — Telephone Encounter (Signed)
I do not see a Rx submitted for Vraylar ? What dose is he on? Not sure how he checked costs?

## 2022-06-20 ENCOUNTER — Other Ambulatory Visit: Payer: Self-pay

## 2022-06-20 DIAGNOSIS — F3130 Bipolar disorder, current episode depressed, mild or moderate severity, unspecified: Secondary | ICD-10-CM

## 2022-06-20 MED ORDER — CARIPRAZINE HCL 1.5 MG PO CAPS: 1.5000 mg | ORAL_CAPSULE | Freq: Every day | ORAL | 1 refills | Status: DC

## 2022-06-20 NOTE — Telephone Encounter (Signed)
I contacted pharmacy to run the [place] and with his Medicare insurance it's $851.00. So pt assistance would be the next step.

## 2022-06-20 NOTE — Telephone Encounter (Signed)
Let him know to apply for pt assistance and can have samples until this is available

## 2022-06-21 ENCOUNTER — Other Ambulatory Visit: Payer: Self-pay

## 2022-06-21 MED ORDER — ARIPIPRAZOLE 2 MG PO TABS: 2.0000 mg | ORAL_TABLET | Freq: Every day | ORAL | 0 refills | Status: DC

## 2022-06-21 NOTE — Telephone Encounter (Signed)
Pt stated he does not think he will qualify based on his income.He wants to just try a new med.

## 2022-06-21 NOTE — Telephone Encounter (Signed)
Pt informed rx sent.

## 2022-06-21 NOTE — Telephone Encounter (Signed)
Historically the [employer] has been very generous with patient assistance and has given it to people regardless of income simply based on the amount of copayment.  However we can retry Abilify which she took several years ago for a while.  That is the closest generic option.  Send then Abilify 2 mg tablets, 1 daily, #30, no refill

## 2022-06-24 ENCOUNTER — Other Ambulatory Visit: Payer: Self-pay | Admitting: Psychiatry

## 2022-06-24 DIAGNOSIS — F5105 Insomnia due to other mental disorder: Secondary | ICD-10-CM

## 2022-06-28 ENCOUNTER — Other Ambulatory Visit: Payer: Self-pay | Admitting: Psychiatry

## 2022-06-28 DIAGNOSIS — F3130 Bipolar disorder, current episode depressed, mild or moderate severity, unspecified: Secondary | ICD-10-CM

## 2022-07-10 ENCOUNTER — Ambulatory Visit (INDEPENDENT_AMBULATORY_CARE_PROVIDER_SITE_OTHER): Payer: Medicare Other | Admitting: Psychiatry

## 2022-07-10 ENCOUNTER — Encounter: Payer: Self-pay | Admitting: Psychiatry

## 2022-07-10 DIAGNOSIS — F3130 Bipolar disorder, current episode depressed, mild or moderate severity, unspecified: Secondary | ICD-10-CM

## 2022-07-10 DIAGNOSIS — G251 Drug-induced tremor: Secondary | ICD-10-CM | POA: Diagnosis not present

## 2022-07-10 DIAGNOSIS — Z79899 Other long term (current) drug therapy: Secondary | ICD-10-CM

## 2022-07-10 DIAGNOSIS — Z636 Dependent relative needing care at home: Secondary | ICD-10-CM

## 2022-07-10 DIAGNOSIS — F5105 Insomnia due to other mental disorder: Secondary | ICD-10-CM | POA: Diagnosis not present

## 2022-07-10 MED ORDER — LAMOTRIGINE 200 MG PO TABS: 200.0000 mg | ORAL_TABLET | Freq: Two times a day (BID) | ORAL | 0 refills | Status: DC

## 2022-07-10 MED ORDER — ARIPIPRAZOLE 2 MG PO TABS: 2.0000 mg | ORAL_TABLET | Freq: Every day | ORAL | 0 refills | Status: DC

## 2022-07-10 NOTE — Progress Notes (Signed)
KIET GEER [id number] 01/02/1951 72 y.o.  Subjective:   [name] is a 72 y.o. (DOB [birth year]) male.  Chief Complaint:  Chief Complaint  Patient presents with   Follow-up    Bipolar I disorder, most recent episode depressed [facility])   Depression   Anxiety    HPI  Drew Padilla presents to the office today for follow-up of bipolar and insomnia.  seen in January 2021.  Good physical.  No otgher meds except Rx here..  12/2019 appointment without medicine changes in the following noted: Best I can.   Stress wife's health and constant caretaking.  Taking more and more out of hime and feels sort of guilty about it.    Aggressive RA is her problem and hip replacement January 2020 without help and complications.  He's doing everything.  Waring on him emotionally.  Not like retirement expected.  Staying in.  W gradual deterioration. Xanax a couple times of week.  Propranolol prn tremor. Minimal tremor. Sleep good.  10/31/2020 appointment with the following noted: Last 2 mos been the darkest bleakest in years.  Doesn't know why.  More depressed.  No energy or motivation for normal activities.  Anhedonia.  Sleep unchanged but may be more naps daytime and still 7-8 hours at night.  No particular trigger except wife's health continues to worsen and he has to do more.  Occ break briefly  Does not leave for extended periods. . No SE lithium generally. Plan:Labs from 12/2019 were normal including low normal lithium level 0.5  Due to relapse repeat labs. Increase pramipexole to 0.375 mg BID for off label depression.  Call in 2 weeks to report effect.  10/31/20 Note on labs and meds: Lithium level has dropped below the therapeutic range to 0.4 on the same dose of 5 of the 150 mg capsules.  He is also relapsed into depression. At the last appointment we increased the pramipexole because of his depression.  If he is better then he does not need to change the lithium.  If his  depression is not better then he needs to increase the lithium to 6 of the 150 mg capsules or we can prescribe 3 of the 300 mg capsules to try to get his lithium level up.  12/25/20 MD noted: Patient has a history of treatment resistant major depression which has largely been under control over the last several years with a combination of lithium, lamotrigine and pramipexole.  At his last appointment in May he had relapsed into depression. We had him increase pramipexole to 0.375 mg twice daily which he calls and reports has not been particularly helpful. We ordered a lithium level was which was low at 0.4 and therefore it was suggested that he increase the lithium but he has been reluctant to do so due to lithium related tremors.  In terms of getting relief from depression, still the best option is to increase the lithium but I understand that will result in more tremors to be managed by propranolol or perhaps some other tremor medicine.   It is not obvious regarding the next step as there are many different options.  I do not like starting a new medication without an appointment so this put him on the cancellation list. In the meantime he can increase pramipexole 1 step further to 0.5 mg twice daily.  As we go higher it might cause some sleepiness and if that is a problem then let us know.  04/02/2021 appointment with the following noted: Doing some better.  Dep can comes in waves and last 1-2 days wihtout pattern nor trigger. Lithium 900 made tremors constant.  Lithium 750 tolerable. Increase pramipexole to 0.5 BID definitely helped. No SE. Stress wife's health. Using propranolol 20-40 prn tremor. Asked about CBD. Plan: Lithium level too low lately at 0.4 on 750 mg daily Increase lithium again to 900/750 QOD Increase propranolol to 40 mg BID prn tremor.  He usually takes just 20 Continue pramipexole to 0.5 mg BID for off label depression.    07/04/2021 appt noted: Doing OK.  Not  consistently  depressed. Did increase lithium. Wife's health still bad and constant stress with multiple issues.  Multiple hip surgeries and dislocations.  Bulk of time is caring for her.  No real help with her.  No family here but some around.  He doesn't ask for it.  W has RA. Plan no med changes: continue lithium again to 900/750 QOD Tremor better with  propranolol to 40 mg BID  Continue pramipexole to 0.5 mg BID for off label depression.   Trazodone 200 mg HS, Xanax  mg prn  01/07/22 appt noted: A lot of change.  Depressed but it comes and goes without pattern.  Can get up feeling good and then crash.  May last a few days, not weeks.  Maybe situational but ongoing with wife and not likely to change. Wife hip replacement for RA.    It has been a disaster with 4 revisions.  He does everything at home.  She's been to rehab and got pressure sore. Trazodone works from 11-2 and awakens and may not get back to sleep 1/2 th time. TIA 1 month ago and work up negative.  SX with balance px and diff speaking fluently and numb left side.   Plan: Start Trintellix 5 then 10 mg daily.  01/09/22 called complaining of N. Encouraged to give it more time. 01/14/22 TC: N intolerable  with Trintellix and stopped it.  Plan start [place] when N resolves. 02/04/22 TC:  He cannot take Auvelity anymore. Side effects- Extreme dizziness, unsteady when walking, not having effect on depression either. He had increased the dose as directed and symptoms were worse.     04/24/22 appt noted: Current psychiatric meds include alprazolam 0.5 mg twice daily as needed anxiety, lamotrigine 200 mg twice daily, Cytomel 37.5 mg every morning, lithium 750 mg nightly, pramipexole 0.5 mg twice daily, propranolol 40 mg twice daily as needed tremor, trazodone 200 mg nightly, Sonata 10 mg as needed insomnia Depression about the same with >50% of the time depressed.  Fights it daily with some days better than others.  Can be having a good day at times and  then drop like a rock.  Maybe stress with home situation is a trigger. Good bit of anxiety tgool.  Infrequent Xanax.  Awake at 2 AM and often for the balance of the night.  May have to get up with wife at night.   Avg 6 hours.  No intentional nap but may doze in recliner. Asks about [place].  Son in law good response. Plan: continue lithium again to 900/750 QOD Tremor better with  propranolol to 40 mg BID  Wean pramipexole and start Vraylar 1.5 mg QOD.   Start Vraylar 1 capsule every other day Reduce pramipexole to 1/2 twice daily for 1 week then 1/2 in the AM for 1 week, then stop it. Call in a couple of weeks if not  better and we'll increase it.  05/03/2022 phone call concerned about expensive Vraylar being $800 a month and he cannot afford that.  He was encouraged to use the samples and we could consider patient to send stents if it was effective. 06/18/22 ran out of samples and concerned about cost.  Encouraged to use samples until the next appt.  But given his fear about this we can retry Abilify 2 mg daily.  07/10/22 appt noted: Taking Abilify 2 mg every morning, lamotrigine 200 mg twice daily, Cytomel 37.5 mcg every morning, lithium 600 alternating with 750 every other day, propranolol 40 mg twice daily, B6, trazodone 200 mg nightly, Sonata 10 mg as needed Vraylar clearly helped with less frequent down and shorter duration depressive periods.  No SE. Switch to Abilify has been good with same sort of benefit.  Still down times but less duration.  No SE.   Dep ranges from 5/10 to 1/10 lately.  Hasn't had the nasty depression he had before Vraylar. Function is ok right now and interest is better.    Sleep is pretty good lately and much better.  Gets up with wife once usually. Usually just takes trazodone at night. Tremor manageable but not gone. Has a lot to do.  Rare breaks for caregiving.  Psych med history:  Ambien CR, trazodone, mirtazapine NR, Lunesta, doxepin, hydroxyzine, unisom,  Belsomra, Seroquel, temazepam,  sonata works. propranolol Lamotrigine, Cytomel lithium,   Pramipexole 0.5 helped Abilify 10 mg 2009 for several months  Fluoxetine, citalopram 80, Wellbutrin, buspirone Trintellix a week with N at 5 Auvelity 2-3 weeks with dizziness, unsteady  Adderall, Strattera, Nuvigil others also Under her psychiatric care since May 2001  Review of Systems:  Review of Systems  Constitutional:  Positive for fatigue.  Cardiovascular:  Negative for chest pain and palpitations.  Gastrointestinal:  Negative for diarrhea.  Endocrine: Negative for polyuria.  Neurological:  Negative for tremors.  Psychiatric/Behavioral:  Positive for dysphoric mood. Negative for agitation, behavioral problems, confusion, decreased concentration, hallucinations, self-injury, sleep disturbance and suicidal ideas. The patient is not nervous/anxious and is not hyperactive.     Medications: I have reviewed the patient's current medications.  Current Outpatient Medications  Medication Sig Dispense Refill   ALPRAZolam (XANAX) 0.5 MG tablet TAKE 1 TABLET BY MOUTH TWICE A DAY AS NEEDED FOR ANXIETY 45 tablet 1   aspirin EC 81 MG tablet Take 81 mg by mouth daily. Swallow whole.     liothyronine (CYTOMEL) 25 MCG tablet TAKE 1 AND 1/2 TABLETS(37.5 MCG) BY MOUTH DAILY 135 tablet 0   lithium carbonate 150 MG capsule TAKE 5 CAPSULES DAILY (Patient taking differently: TAKE 5 CAPSULES EVERY OTHER DAY) 450 capsule 0   lithium carbonate 300 MG capsule TAKE 3 CAPSULES BY MOUTH AT BEDTIME. (Patient taking differently: TAKE 3 CAPSULES BY MOUTH AT BEDTIME EVERY OTHER DAY) 270 capsule 0   propranolol (INDERAL) 40 MG tablet TAKE 1 TABLET (40 MG TOTAL) BY MOUTH 2 (TWO) TIMES DAILY AS NEEDED (TREMOR). 180 tablet 1   Pyridoxine HCl (VITAMIN B-6) 500 MG tablet Take 500 mg by mouth 2 (two) times a day.     traZODone (DESYREL) 100 MG tablet TAKE 2 TABLETS BY MOUTH AT BEDTIME 180 tablet 0   zaleplon (SONATA) 10 MG  capsule TAKE 1 CAPSULE BY MOUTH AT NIGHT IF HE AWAKENS AND STILL HAS 4 HOURS LEFT TO SLEEP 30 capsule 1   ARIPiprazole (ABILIFY) 2 MG tablet Take 1 tablet (2 mg total) by mouth daily.  90 tablet 0   lamoTRIgine (LAMICTAL) 200 MG tablet Take 1 tablet (200 mg total) by mouth 2 (two) times daily. 180 tablet 0   No current facility-administered medications for this visit.    Medication Side Effects: Other: tremor managed with B6  Allergies: No Known Allergies  Past Medical History:  Diagnosis Date   Adenomatous colon polyp 2003   on 2003 colonoscopy (and on 2013 colon; numerous; next 01/2015)   Bipolar disorder ([place])    with primary depressive symptoms   Chronic headaches    Diverticulosis 2003   on colonoscopy   Melanoma ([place]) 1990   R arm   Obstructive sleep apnea    Syncope 11/16/2015   Tremor     Family History  Problem Relation Age of Onset   Hypertension Father    Sleep apnea Father    Coronary artery disease Father    Diabetes Father     Social History   Socioeconomic History   Marital status: Married    Spouse name: Debbie   Number of children: Not on file   Years of education: Not on file   Highest education level: Not on file  Occupational History   Occupation: funeral service    Employer: [employer]  Tobacco Use   Smoking status: Former    Types: Cigarettes    Quit date: 1990    Years since quitting: 34.0   Smokeless tobacco: Never  Substance and Sexual Activity   Alcohol use: No   Drug use: No   Sexual activity: Not on file  Other Topics Concern   Not on file  Social History Narrative   Caffeine: 1 serving daily   Social Determinants of Health   Financial Resource Strain: Not on file  Food Insecurity: Not on file  Transportation Needs: Not on file  Physical Activity: Not on file  Stress: Not on file  Social Connections: Not on file  Intimate Partner Violence: Not on file    Past Medical History, Surgical history, Social history, and  Family history were reviewed and updated as appropriate.   Please see review of systems for further details on the patient's review from today.   Objective:   Physical Exam:  There were no vitals taken for this visit.  Physical Exam Constitutional:      General: He is not in acute distress.    Appearance: He is well-developed.  Musculoskeletal:        General: No deformity.  Neurological:     Mental Status: He is alert and oriented to person, place, and time.     Motor: No tremor.     Coordination: Coordination normal.     Gait: Gait normal.  Psychiatric:        Attention and Perception: Attention normal. He is attentive.        Mood and Affect: Mood is depressed. Mood is not anxious. Affect is not labile, blunt, angry, tearful or inappropriate.        Speech: Speech normal.        Behavior: Behavior normal.        Thought Content: Thought content normal. Thought content is not delusional. Thought content does not include homicidal or suicidal ideation. Thought content does not include suicidal plan.        Cognition and Memory: Cognition normal.        Judgment: Judgment normal.     Comments: Insight is good. Dep at least 70% better Less tired  Lab Review:     Component Value Date/Time   NA 138 11/27/2020 1331   K 4.2 11/27/2020 1331   CL 106 11/27/2020 1331   CO2 25 11/27/2020 1331   GLUCOSE 77 11/27/2020 1331   BUN 11 11/27/2020 1331   CREATININE 1.01 11/27/2020 1331   CALCIUM 9.5 11/27/2020 1331   PROT 5.6 (L) 12/12/2015 0702   ALBUMIN 3.8 12/12/2015 0702   AST 18 12/12/2015 0702   ALT 15 (L) 12/12/2015 0702   ALKPHOS 45 12/12/2015 0702   BILITOT 0.8 12/12/2015 0702   GFRNONAA 60 (L) 12/12/2015 0702   GFRAA >60 12/12/2015 0702       Component Value Date/Time   WBC 6.7 12/12/2015 0702   RBC 4.65 12/12/2015 0702   HGB 13.8 12/12/2015 0702   HCT 40.8 12/12/2015 0702   PLT 120 (L) 12/12/2015 0702   MCV 87.7 12/12/2015 0702   MCH 29.7 12/12/2015 0702    MCHC 33.8 12/12/2015 0702   RDW 12.7 12/12/2015 0702   LYMPHSABS 1.3 12/11/2015 1348   MONOABS 0.4 12/11/2015 1348   EOSABS 0.1 12/11/2015 1348   BASOSABS 0.0 12/11/2015 1348    Lithium Lvl  Date Value Ref Range Status  11/27/2020 0.4 (L) 0.6 - 1.2 mmol/L Final   10/31/20 Note on labs and meds: Lithium level has dropped below the therapeutic range to 0.4 on the same dose of 5 of the 150 mg capsules.   No results found for: "PHENYTOIN", "PHENOBARB", "VALPROATE", "CBMZ"   .res Assessment: Plan:    Drew Padilla was seen today for follow-up, depression and anxiety.  Diagnoses and all orders for this visit:  Bipolar I disorder, most recent episode depressed (HCC) -     Lithium level -     TSH -     Basic metabolic panel -     ARIPiprazole (ABILIFY) 2 MG tablet; Take 1 tablet (2 mg total) by mouth daily. -     lamoTRIgine (LAMICTAL) 200 MG tablet; Take 1 tablet (200 mg total) by mouth 2 (two) times daily.  Insomnia due to mental condition  Lithium-induced tremor  Lithium use -     Lithium level -     TSH -     Basic metabolic panel  Caregiver stress   Relapse depression mostly responsive to Abilify 2 mg gives him about the same amount of benefit as he got with the Vraylar 1.5 which she could not afford.  He has residual depression. Doesn't tolerate lithium 900 dT tremor.  Supportive therapy on importance self care and getting out for himself.  Disc his guilt over conflicted feelings in detail. Disc longterm plans.  Disc care-giving stress and need for respite at times.    Mood less depressed markedlythan in the past.  caretaking fatigue.  Emphasized getting breaks from this for his own physical and mental well-being.  Counseled patient regarding potential benefits, risks, and side effects of lithium to include potential risk of lithium affecting thyroid and renal function.  Discussed need for periodic lab monitoring to determine drug level and to assess for potential adverse  effects.  Counseled patient regarding signs and symptoms of lithium toxicity and advised that they notify office immediately or seek urgent medical attention if experiencing these signs and symptoms.  Patient advised to contact office with any questions or concerns. Lithium level too low lately at 0.4 on 750 mg daily continue lithium again to 900/750 QOD Tremor better with  propranolol to 40 mg BID   Continue Abilify  2 mg daily bc helped.  Check lithium labs  FU 2-3 mos  Lynder Parents, MD, DFAPA   Please see After Visit Summary for patient specific instructions.  No future appointments.    Orders Placed This Encounter  Procedures   Lithium level   TSH   Basic metabolic panel       -------------------------------

## 2022-07-17 ENCOUNTER — Other Ambulatory Visit: Payer: Self-pay | Admitting: Psychiatry

## 2022-07-17 DIAGNOSIS — F3130 Bipolar disorder, current episode depressed, mild or moderate severity, unspecified: Secondary | ICD-10-CM

## 2022-08-03 ENCOUNTER — Other Ambulatory Visit: Payer: Self-pay | Admitting: Psychiatry

## 2022-08-03 DIAGNOSIS — F3130 Bipolar disorder, current episode depressed, mild or moderate severity, unspecified: Secondary | ICD-10-CM

## 2022-08-13 ENCOUNTER — Other Ambulatory Visit: Payer: Self-pay | Admitting: Psychiatry

## 2022-08-13 DIAGNOSIS — F3130 Bipolar disorder, current episode depressed, mild or moderate severity, unspecified: Secondary | ICD-10-CM

## 2022-08-20 ENCOUNTER — Other Ambulatory Visit: Payer: Self-pay | Admitting: Psychiatry

## 2022-08-20 DIAGNOSIS — F3130 Bipolar disorder, current episode depressed, mild or moderate severity, unspecified: Secondary | ICD-10-CM

## 2022-08-21 ENCOUNTER — Other Ambulatory Visit: Payer: Self-pay | Admitting: Psychiatry

## 2022-08-21 DIAGNOSIS — F411 Generalized anxiety disorder: Secondary | ICD-10-CM

## 2022-09-04 DIAGNOSIS — D126 Benign neoplasm of colon, unspecified: Secondary | ICD-10-CM | POA: Diagnosis not present

## 2022-09-04 DIAGNOSIS — K5730 Diverticulosis of large intestine without perforation or abscess without bleeding: Secondary | ICD-10-CM | POA: Diagnosis not present

## 2022-09-04 DIAGNOSIS — D125 Benign neoplasm of sigmoid colon: Secondary | ICD-10-CM | POA: Diagnosis not present

## 2022-09-04 DIAGNOSIS — D124 Benign neoplasm of descending colon: Secondary | ICD-10-CM | POA: Diagnosis not present

## 2022-09-04 DIAGNOSIS — Z86010 Personal history of colonic polyps: Secondary | ICD-10-CM | POA: Diagnosis not present

## 2022-09-04 DIAGNOSIS — D123 Benign neoplasm of transverse colon: Secondary | ICD-10-CM | POA: Diagnosis not present

## 2022-09-04 DIAGNOSIS — Z09 Encounter for follow-up examination after completed treatment for conditions other than malignant neoplasm: Secondary | ICD-10-CM | POA: Diagnosis not present

## 2022-09-04 DIAGNOSIS — K648 Other hemorrhoids: Secondary | ICD-10-CM | POA: Diagnosis not present

## 2022-09-11 ENCOUNTER — Other Ambulatory Visit: Payer: Self-pay | Admitting: Psychiatry

## 2022-09-11 DIAGNOSIS — F5105 Insomnia due to other mental disorder: Secondary | ICD-10-CM

## 2022-09-24 ENCOUNTER — Other Ambulatory Visit: Payer: Self-pay | Admitting: Psychiatry

## 2022-09-24 DIAGNOSIS — G251 Drug-induced tremor: Secondary | ICD-10-CM

## 2022-09-25 DIAGNOSIS — F3130 Bipolar disorder, current episode depressed, mild or moderate severity, unspecified: Secondary | ICD-10-CM | POA: Diagnosis not present

## 2022-09-25 DIAGNOSIS — Z79899 Other long term (current) drug therapy: Secondary | ICD-10-CM | POA: Diagnosis not present

## 2022-09-26 LAB — TSH: TSH: 0.7 mIU/L (ref 0.40–4.50)

## 2022-09-26 LAB — BASIC METABOLIC PANEL
BUN: 12 mg/dL (ref 7–25)
CO2: 23 mmol/L (ref 20–32)
Calcium: 9.2 mg/dL (ref 8.6–10.3)
Chloride: 108 mmol/L (ref 98–110)
Creat: 1.05 mg/dL (ref 0.70–1.28)
Glucose, Bld: 93 mg/dL (ref 65–99)
Potassium: 3.6 mmol/L (ref 3.5–5.3)
Sodium: 140 mmol/L (ref 135–146)

## 2022-09-26 LAB — LITHIUM LEVEL: Lithium Lvl: 0.4 mmol/L — ABNORMAL LOW (ref 0.6–1.2)

## 2022-10-05 ENCOUNTER — Other Ambulatory Visit: Payer: Self-pay | Admitting: Psychiatry

## 2022-10-05 DIAGNOSIS — F3130 Bipolar disorder, current episode depressed, mild or moderate severity, unspecified: Secondary | ICD-10-CM

## 2022-10-09 ENCOUNTER — Ambulatory Visit: Payer: Medicare Other | Admitting: Psychiatry

## 2022-10-13 ENCOUNTER — Other Ambulatory Visit: Payer: Self-pay | Admitting: Psychiatry

## 2022-10-13 DIAGNOSIS — F3130 Bipolar disorder, current episode depressed, mild or moderate severity, unspecified: Secondary | ICD-10-CM

## 2022-10-18 ENCOUNTER — Ambulatory Visit (INDEPENDENT_AMBULATORY_CARE_PROVIDER_SITE_OTHER): Payer: Medicare Other | Admitting: Psychiatry

## 2022-10-18 ENCOUNTER — Encounter: Payer: Self-pay | Admitting: Psychiatry

## 2022-10-18 DIAGNOSIS — F3130 Bipolar disorder, current episode depressed, mild or moderate severity, unspecified: Secondary | ICD-10-CM | POA: Diagnosis not present

## 2022-10-18 DIAGNOSIS — F5105 Insomnia due to other mental disorder: Secondary | ICD-10-CM

## 2022-10-18 DIAGNOSIS — G251 Drug-induced tremor: Secondary | ICD-10-CM | POA: Diagnosis not present

## 2022-10-18 DIAGNOSIS — Z636 Dependent relative needing care at home: Secondary | ICD-10-CM

## 2022-10-18 DIAGNOSIS — Z79899 Other long term (current) drug therapy: Secondary | ICD-10-CM

## 2022-10-18 MED ORDER — LAMOTRIGINE 200 MG PO TABS: 200.0000 mg | ORAL_TABLET | Freq: Two times a day (BID) | ORAL | 1 refills | Status: DC

## 2022-10-18 MED ORDER — LIOTHYRONINE SODIUM 25 MCG PO TABS: ORAL_TABLET | ORAL | 1 refills | Status: DC

## 2022-10-18 MED ORDER — PRAMIPEXOLE DIHYDROCHLORIDE 0.5 MG PO TABS: ORAL_TABLET | ORAL | 1 refills | Status: DC

## 2022-10-18 NOTE — Patient Instructions (Addendum)
Pramipexole 0.5 mg 1/2 tablet twice daily for a couple of days  then 1 tablet twice daily for 4 days  then 1 and 1/2 tablets twice daily.  Stop Abilify

## 2022-10-18 NOTE — Progress Notes (Signed)
Drew Padilla [id number] Aug 26, 1950 72 y.o.  Subjective:   [name] is a 72 y.o. (DOB Jun 13, 1951) male.  Chief Complaint:  Chief Complaint  Patient presents with   Follow-up   Depression   Stress    HPI  Drew Padilla presents to the office today for follow-up of bipolar and insomnia.  seen in January 2021.  Good physical.  No otgher meds except Rx here..  12/2019 appointment without medicine changes in the following noted: Best I can.   Stress wife's health and constant caretaking.  Taking more and more out of hime and feels sort of guilty about it.    Aggressive RA is her problem and hip replacement January 2020 without help and complications.  He's doing everything.  Waring on him emotionally.  Not like retirement expected.  Staying in.  W gradual deterioration. Xanax a couple times of week.  Propranolol prn tremor. Minimal tremor. Sleep good.  10/31/2020 appointment with the following noted: Last 2 mos been the darkest bleakest in years.  Doesn't know why.  More depressed.  No energy or motivation for normal activities.  Anhedonia.  Sleep unchanged but may be more naps daytime and still 7-8 hours at night.  No particular trigger except wife's health continues to worsen and he has to do more.  Occ break briefly  Does not leave for extended periods. . No SE lithium generally. Plan:Labs from 12/2019 were normal including low normal lithium level 0.5  Due to relapse repeat labs. Increase pramipexole to 0.375 mg BID for off label depression.  Call in 2 weeks to report effect.  10/31/20 Note on labs and meds: Lithium level has dropped below the therapeutic range to 0.4 on the same dose of 5 of the 150 mg capsules.  He is also relapsed into depression. At the last appointment we increased the pramipexole because of his depression.  If he is better then he does not need to change the lithium.  If his depression is not better then he needs to increase the lithium  to 6 of the 150 mg capsules or we can prescribe 3 of the 300 mg capsules to try to get his lithium level up.  12/25/20 MD noted: Patient has a history of treatment resistant major depression which has largely been under control over the last several years with a combination of lithium, lamotrigine and pramipexole.  At his last appointment in May he had relapsed into depression. We had him increase pramipexole to 0.375 mg twice daily which he calls and reports has not been particularly helpful. We ordered a lithium level was which was low at 0.4 and therefore it was suggested that he increase the lithium but he has been reluctant to do so due to lithium related tremors.  In terms of getting relief from depression, still the best option is to increase the lithium but I understand that will result in more tremors to be managed by propranolol or perhaps some other tremor medicine.   It is not obvious regarding the next step as there are many different options.  I do not like starting a new medication without an appointment so this put him on the cancellation list. In the meantime he can increase pramipexole 1 step further to 0.5 mg twice daily.  As we go higher it might cause some sleepiness and if that is a problem then let us know.   04/02/2021 appointment with the following noted: Doing some better.  Dep  can comes in waves and last 1-2 days wihtout pattern nor trigger. Lithium 900 made tremors constant.  Lithium 750 tolerable. Increase pramipexole to 0.5 BID definitely helped. No SE. Stress wife's health. Using propranolol 20-40 prn tremor. Asked about CBD. Plan: Lithium level too low lately at 0.4 on 750 mg daily Increase lithium again to 900/750 QOD Increase propranolol to 40 mg BID prn tremor.  He usually takes just 20 Continue pramipexole to 0.5 mg BID for off label depression.    07/04/2021 appt noted: Doing OK.  Not  consistently depressed. Did increase lithium. Wife's health still bad and  constant stress with multiple issues.  Multiple hip surgeries and dislocations.  Bulk of time is caring for her.  No real help with her.  No family here but some around.  He doesn't ask for it.  W has RA. Plan no med changes: continue lithium again to 900/750 QOD Tremor better with  propranolol to 40 mg BID  Continue pramipexole to 0.5 mg BID for off label depression.   Trazodone 200 mg HS, Xanax  mg prn  01/07/22 appt noted: A lot of change.  Depressed but it comes and goes without pattern.  Can get up feeling good and then crash.  May last a few days, not weeks.  Maybe situational but ongoing with wife and not likely to change. Wife hip replacement for RA.    It has been a disaster with 4 revisions.  He does everything at home.  She's been to rehab and got pressure sore. Trazodone works from 11-2 and awakens and may not get back to sleep 1/2 th time. TIA 1 month ago and work up negative.  SX with balance px and diff speaking fluently and numb left side.   Plan: Start Trintellix 5 then 10 mg daily.  01/09/22 called complaining of N. Encouraged to give it more time. 01/14/22 TC: N intolerable  with Trintellix and stopped it.  Plan start Auvelity when N resolves. 02/04/22 TC:  He cannot take Auvelity anymore. Side effects- Extreme dizziness, unsteady when walking, not having effect on depression either. He had increased the dose as directed and symptoms were worse.     04/24/22 appt noted: Current psychiatric meds include alprazolam 0.5 mg twice daily as needed anxiety, lamotrigine 200 mg twice daily, Cytomel 37.5 mg every morning, lithium 750 mg nightly, pramipexole 0.5 mg twice daily, propranolol 40 mg twice daily as needed tremor, trazodone 200 mg nightly, Sonata 10 mg as needed insomnia Depression about the same with >50% of the time depressed.  Fights it daily with some days better than others.  Can be having a good day at times and then drop like a rock.  Maybe stress with home situation is a  trigger. Good bit of anxiety tgool.  Infrequent Xanax.  Awake at 2 AM and often for the balance of the night.  May have to get up with wife at night.   Avg 6 hours.  No intentional nap but may doze in recliner. Asks about Spravato.  Son in law good response. Plan: continue lithium again to 900/750 QOD Tremor better with  propranolol to 40 mg BID  Wean pramipexole and start Vraylar 1.5 mg QOD.   Start Vraylar 1 capsule every other day Reduce pramipexole to 1/2 twice daily for 1 week then 1/2 in the AM for 1 week, then stop it. Call in a couple of weeks if not better and we'll increase it.  05/03/2022 phone call concerned about  expensive Vraylar being $800 a month and he cannot afford that.  He was encouraged to use the samples and we could consider patient to send stents if it was effective. 06/18/22 ran out of samples and concerned about cost.  Encouraged to use samples until the next appt.  But given his fear about this we can retry Abilify 2 mg daily.  07/10/22 appt noted: Taking Abilify 2 mg every morning, lamotrigine 200 mg twice daily, Cytomel 37.5 mcg every morning, lithium 600 alternating with 750 every other day, propranolol 40 mg twice daily, B6, trazodone 200 mg nightly, Sonata 10 mg as needed Vraylar clearly helped with less frequent down and shorter duration depressive periods.  No SE. Switch to Abilify has been good with same sort of benefit.  Still down times but less duration.  No SE.   Dep ranges from 5/10 to 1/10 lately.  Hasn't had the nasty depression he had before Vraylar. Function is ok right now and interest is better.    Sleep is pretty good lately and much better.  Gets up with wife once usually. Usually just takes trazodone at night. Tremor manageable but not gone. Has a lot to do.  Rare breaks for caregiving.  10/18/22 appt noted; About the same.  With good and bad days and managing ok.   Continues meds.  No SE.  Tremor managed. Sleep is fine. Still consumed with  care for wife.  Won't get better.  Her arthritis is advanced so far she requires a lot of care. He's doing all the care.  Not a lot of downtime.  Will read a good deal and yard work.  Can get away for short periods of time. Goes to [employer] occassionally.     Psych med history:  Ambien CR, trazodone, mirtazapine NR, Lunesta, doxepin, hydroxyzine, unisom, Belsomra, Seroquel, temazepam,  sonata works. propranolol Lamotrigine, Cytomel lithium,   Pramipexole 0.5 BID helped Abilify 10 mg 2009 for several months  Fluoxetine, citalopram 80, Wellbutrin, buspirone Trintellix a week with N at 5 Auvelity 2-3 weeks with dizziness, unsteady  Adderall, Strattera, Nuvigil others also Under her psychiatric care since May 2001  Review of Systems:  Review of Systems  Constitutional:  Positive for fatigue.  Cardiovascular:  Negative for palpitations.  Gastrointestinal:  Negative for diarrhea.  Endocrine: Negative for polyuria.  Neurological:  Negative for tremors.  Psychiatric/Behavioral:  Positive for dysphoric mood. Negative for agitation, behavioral problems, confusion, decreased concentration, hallucinations, self-injury, sleep disturbance and suicidal ideas. The patient is not nervous/anxious and is not hyperactive.     Medications: I have reviewed the patient's current medications.  Current Outpatient Medications  Medication Sig Dispense Refill   ALPRAZolam (XANAX) 0.5 MG tablet TAKE 1 TABLET BY MOUTH TWICE A DAY AS NEEDED FOR ANXIETY 45 tablet 1   aspirin EC 81 MG tablet Take 81 mg by mouth daily. Swallow whole.     lithium carbonate 150 MG capsule TAKE 5 CAPSULES DAILY 150 capsule 0   lithium carbonate 300 MG capsule TAKE 3 CAPSULES BY MOUTH AT BEDTIME 270 capsule 0   pramipexole (MIRAPEX) 0.5 MG tablet 1/2 tablet twice daily for 3 day, then 1 tablet twice daily for 4 days, then 1 and 1/2 tablets twice daily. 90 tablet 1   propranolol (INDERAL) 40 MG tablet TAKE 1 TABLET (40 MG  TOTAL) BY MOUTH 2 (TWO) TIMES DAILY AS NEEDED (TREMOR). 180 tablet 1   Pyridoxine HCl (VITAMIN B-6) 500 MG tablet Take 500 mg by mouth 2 (  two) times a day.     traZODone (DESYREL) 100 MG tablet TAKE 2 TABLETS BY MOUTH AT BEDTIME 180 tablet 0   zaleplon (SONATA) 10 MG capsule TAKE 1 CAPSULE BY MOUTH AT NIGHT IF HE AWAKENS AND STILL HAS 4 HOURS LEFT TO SLEEP 30 capsule 1   lamoTRIgine (LAMICTAL) 200 MG tablet Take 1 tablet (200 mg total) by mouth 2 (two) times daily. 180 tablet 1   liothyronine (CYTOMEL) 25 MCG tablet TAKE 1 AND 1/2 TABLETS(37.5 MCG) BY MOUTH DAILY 135 tablet 1   No current facility-administered medications for this visit.    Medication Side Effects: Other: tremor managed with B6  Allergies: No Known Allergies  Past Medical History:  Diagnosis Date   Adenomatous colon polyp 2003   on 2003 colonoscopy (and on 2013 colon; numerous; next 01/2015)   Bipolar disorder (HCC)    with primary depressive symptoms   Chronic headaches    Diverticulosis 2003   on colonoscopy   Melanoma (HCC) 1990   R arm   Obstructive sleep apnea    Syncope 11/16/2015   Tremor     Family History  Problem Relation Age of Onset   Hypertension Father    Sleep apnea Father    Coronary artery disease Father    Diabetes Father     Social History   Socioeconomic History   Marital status: Married    Spouse name: Debbie   Number of children: Not on file   Years of education: Not on file   Highest education level: Not on file  Occupational History   Occupation: funeral service    Employer: [employer]  Tobacco Use   Smoking status: Former    Types: Cigarettes    Quit date: 1990    Years since quitting: 34.3   Smokeless tobacco: Never  Substance and Sexual Activity   Alcohol use: No   Drug use: No   Sexual activity: Not on file  Other Topics Concern   Not on file  Social History Narrative   Caffeine: 1 serving daily   Social Determinants of Health   Financial Resource  Strain: Not on file  Food Insecurity: Not on file  Transportation Needs: Not on file  Physical Activity: Not on file  Stress: Not on file  Social Connections: Not on file  Intimate Partner Violence: Not on file    Past Medical History, Surgical history, Social history, and Family history were reviewed and updated as appropriate.   Please see review of systems for further details on the patient's review from today.   Objective:   Physical Exam:  There were no vitals taken for this visit.  Physical Exam Constitutional:      General: He is not in acute distress.    Appearance: He is well-developed.  Musculoskeletal:        General: No deformity.  Neurological:     Mental Status: He is alert and oriented to person, place, and time.     Motor: No tremor.     Coordination: Coordination normal.     Gait: Gait normal.  Psychiatric:        Attention and Perception: Attention normal. He is attentive.        Mood and Affect: Mood is depressed. Mood is not anxious. Affect is not labile, blunt, angry, tearful or inappropriate.        Speech: Speech normal.        Behavior: Behavior normal.  Thought Content: Thought content normal. Thought content is not delusional. Thought content does not include homicidal or suicidal ideation. Thought content does not include suicidal plan.        Cognition and Memory: Cognition normal.        Judgment: Judgment normal.     Comments: Insight is good. Dep at least 70% better but not in remission Less tired     Lab Review:     Component Value Date/Time   NA 140 09/25/2022 1558   K 3.6 09/25/2022 1558   CL 108 09/25/2022 1558   CO2 23 09/25/2022 1558   GLUCOSE 93 09/25/2022 1558   BUN 12 09/25/2022 1558   CREATININE 1.05 09/25/2022 1558   CALCIUM 9.2 09/25/2022 1558   PROT 5.6 (L) 12/12/2015 0702   ALBUMIN 3.8 12/12/2015 0702   AST 18 12/12/2015 0702   ALT 15 (L) 12/12/2015 0702   ALKPHOS 45 12/12/2015 0702   BILITOT 0.8 12/12/2015  0702   GFRNONAA 60 (L) 12/12/2015 0702   GFRAA >60 12/12/2015 0702       Component Value Date/Time   WBC 6.7 12/12/2015 0702   RBC 4.65 12/12/2015 0702   HGB 13.8 12/12/2015 0702   HCT 40.8 12/12/2015 0702   PLT 120 (L) 12/12/2015 0702   MCV 87.7 12/12/2015 0702   MCH 29.7 12/12/2015 0702   MCHC 33.8 12/12/2015 0702   RDW 12.7 12/12/2015 0702   LYMPHSABS 1.3 12/11/2015 1348   MONOABS 0.4 12/11/2015 1348   EOSABS 0.1 12/11/2015 1348   BASOSABS 0.0 12/11/2015 1348    Lithium Lvl  Date Value Ref Range Status  09/25/2022 0.4 (L) 0.6 - 1.2 mmol/L Final   10/31/20 Note on labs and meds: Lithium level has dropped below the therapeutic range to 0.4 on the same dose of 5 of the 150 mg capsules.   No results found for: "PHENYTOIN", "PHENOBARB", "VALPROATE", "CBMZ"   .res Assessment: Plan:    Buzz was seen today for follow-up, depression and stress.  Diagnoses and all orders for this visit:  Bipolar I disorder, most recent episode depressed (HCC) -     lamoTRIgine (LAMICTAL) 200 MG tablet; Take 1 tablet (200 mg total) by mouth 2 (two) times daily. -     liothyronine (CYTOMEL) 25 MCG tablet; TAKE 1 AND 1/2 TABLETS(37.5 MCG) BY MOUTH DAILY -     pramipexole (MIRAPEX) 0.5 MG tablet; 1/2 tablet twice daily for 3 day, then 1 tablet twice daily for 4 days, then 1 and 1/2 tablets twice daily.  Insomnia due to mental condition  Lithium-induced tremor  Lithium use  Caregiver stress   Relapse depression mostly responsive to Abilify 2 mg gives him about the same amount of benefit as he got with the Vraylar 1.5 which she could not afford.  He has residual depression.  He wants to try something else.  Disc dosing range on pramipexole and studies that disc this and his prior experience of benefit.  Increasing Abilify unlikely to help depression. DC Abilify and retry pramipexole and increase to 1.5-2 mg daily.  Doesn't tolerate lithium 900 dT tremor.  Supportive therapy on importance  self care and getting out for himself.  Disc his guilt over conflicted feelings in detail. Disc longterm plans.  Disc care-giving stress and need for respite at times.    Mood less depressed markedlythan in the past.  caretaking fatigue.  Emphasized getting breaks from this for his own physical and mental well-being.  We discussed the short-term risks  associated with benzodiazepines including sedation and increased fall risk among others.  Discussed long-term side effect risk including dependence, potential withdrawal symptoms, and the potential eventual dose-related risk of dementia.  But recent studies from 2020 dispute this association between benzodiazepines and dementia risk. Newer studies in 2020 do not support an association with dementia. Alprazolam prn 0.5 mg   Counseled patient regarding potential benefits, risks, and side effects of lithium to include potential risk of lithium affecting thyroid and renal function.  Discussed need for periodic lab monitoring to determine drug level and to assess for potential adverse effects.  Counseled patient regarding signs and symptoms of lithium toxicity and advised that they notify office immediately or seek urgent medical attention if experiencing these signs and symptoms.  Patient advised to contact office with any questions or concerns. Lithium level too low lately at 0.4 on 750 mg daily continue lithium again to 900/750 QOD Tremor better with  propranolol to 40 mg BID  Trazodone 200 mg HS Prn Sonata 10 mg HS Start pramipexole: 0.5 mg 1/2 tablet twice daily for a couple of days  then 1 tablet twice daily for 4 days  then 1 and 1/2 tablets twice daily. Lamotrigine  200 BID  Check lithium labs  FU 2 mos  Meredith Staggers, MD, DFAPA   Please see After Visit Summary for patient specific instructions.  No future appointments.    No orders of the defined types were placed in this encounter.      -------------------------------

## 2022-11-02 ENCOUNTER — Other Ambulatory Visit: Payer: Self-pay | Admitting: Psychiatry

## 2022-11-02 DIAGNOSIS — F3130 Bipolar disorder, current episode depressed, mild or moderate severity, unspecified: Secondary | ICD-10-CM

## 2022-11-02 DIAGNOSIS — F5105 Insomnia due to other mental disorder: Secondary | ICD-10-CM

## 2022-11-10 ENCOUNTER — Other Ambulatory Visit: Payer: Self-pay | Admitting: Psychiatry

## 2022-11-10 DIAGNOSIS — F3130 Bipolar disorder, current episode depressed, mild or moderate severity, unspecified: Secondary | ICD-10-CM

## 2022-11-14 ENCOUNTER — Other Ambulatory Visit: Payer: Self-pay | Admitting: Psychiatry

## 2022-11-14 DIAGNOSIS — F3130 Bipolar disorder, current episode depressed, mild or moderate severity, unspecified: Secondary | ICD-10-CM

## 2022-11-18 ENCOUNTER — Other Ambulatory Visit: Payer: Self-pay | Admitting: Psychiatry

## 2022-11-18 DIAGNOSIS — F3130 Bipolar disorder, current episode depressed, mild or moderate severity, unspecified: Secondary | ICD-10-CM

## 2022-11-30 ENCOUNTER — Other Ambulatory Visit: Payer: Self-pay | Admitting: Psychiatry

## 2022-11-30 DIAGNOSIS — F411 Generalized anxiety disorder: Secondary | ICD-10-CM

## 2022-12-18 ENCOUNTER — Ambulatory Visit: Payer: Medicare Other | Admitting: Psychiatry

## 2022-12-23 ENCOUNTER — Other Ambulatory Visit: Payer: Self-pay | Admitting: Psychiatry

## 2022-12-23 DIAGNOSIS — F5105 Insomnia due to other mental disorder: Secondary | ICD-10-CM

## 2022-12-23 NOTE — Telephone Encounter (Signed)
Appt 7/10

## 2022-12-25 ENCOUNTER — Encounter: Payer: Self-pay | Admitting: Psychiatry

## 2022-12-25 ENCOUNTER — Ambulatory Visit (INDEPENDENT_AMBULATORY_CARE_PROVIDER_SITE_OTHER): Payer: Medicare Other | Admitting: Psychiatry

## 2022-12-25 DIAGNOSIS — Z636 Dependent relative needing care at home: Secondary | ICD-10-CM

## 2022-12-25 DIAGNOSIS — G251 Drug-induced tremor: Secondary | ICD-10-CM | POA: Diagnosis not present

## 2022-12-25 DIAGNOSIS — F5105 Insomnia due to other mental disorder: Secondary | ICD-10-CM | POA: Diagnosis not present

## 2022-12-25 DIAGNOSIS — Z79899 Other long term (current) drug therapy: Secondary | ICD-10-CM | POA: Diagnosis not present

## 2022-12-25 DIAGNOSIS — F3130 Bipolar disorder, current episode depressed, mild or moderate severity, unspecified: Secondary | ICD-10-CM

## 2022-12-25 MED ORDER — PRAMIPEXOLE DIHYDROCHLORIDE 0.5 MG PO TABS: 1.0000 mg | ORAL_TABLET | Freq: Two times a day (BID) | ORAL | 0 refills | Status: DC

## 2022-12-25 NOTE — Progress Notes (Signed)
Drew Padilla [id number] [birth year] 72 y.o.  Subjective:   [name] is a 72 y.o. (DOB 1950/06/22) male.  Chief Complaint:  Chief Complaint  Patient presents with   Follow-up   Depression   Stress    HPI  Drew Padilla presents to the office today for follow-up of bipolar and insomnia.  seen in January 2021.  Good physical.  No otgher meds except Rx here..  12/2019 appointment without medicine changes in the following noted: Best I can.   Stress wife's health and constant caretaking.  Taking more and more out of hime and feels sort of guilty about it.    Aggressive RA is her problem and hip replacement January 2020 without help and complications.  He's doing everything.  Waring on him emotionally.  Not like retirement expected.  Staying in.  W gradual deterioration. Xanax a couple times of week.  Propranolol prn tremor. Minimal tremor. Sleep good.  10/31/2020 appointment with the following noted: Last 2 mos been the darkest bleakest in years.  Doesn't know why.  More depressed.  No energy or motivation for normal activities.  Anhedonia.  Sleep unchanged but may be more naps daytime and still 7-8 hours at night.  No particular trigger except wife's health continues to worsen and he has to do more.  Occ break briefly  Does not leave for extended periods. . No SE lithium generally. Plan:Labs from 12/2019 were normal including low normal lithium level 0.5  Due to relapse repeat labs. Increase pramipexole to 0.375 mg BID for off label depression.  Call in 2 weeks to report effect.  10/31/20 Note on labs and meds: Lithium level has dropped below the therapeutic range to 0.4 on the same dose of 5 of the 150 mg capsules.  He is also relapsed into depression. At the last appointment we increased the pramipexole because of his depression.  If he is better then he does not need to change the lithium.  If his depression is not better then he needs to increase the lithium  to 6 of the 150 mg capsules or we can prescribe 3 of the 300 mg capsules to try to get his lithium level up.  12/25/20 MD noted: Patient has a history of treatment resistant major depression which has largely been under control over the last several years with a combination of lithium, lamotrigine and pramipexole.  At his last appointment in May he had relapsed into depression. We had him increase pramipexole to 0.375 mg twice daily which he calls and reports has not been particularly helpful. We ordered a lithium level was which was low at 0.4 and therefore it was suggested that he increase the lithium but he has been reluctant to do so due to lithium related tremors.  In terms of getting relief from depression, still the best option is to increase the lithium but I understand that will result in more tremors to be managed by propranolol or perhaps some other tremor medicine.   It is not obvious regarding the next step as there are many different options.  I do not like starting a new medication without an appointment so this put him on the cancellation list. In the meantime he can increase pramipexole 1 step further to 0.5 mg twice daily.  As we go higher it might cause some sleepiness and if that is a problem then let us know.   04/02/2021 appointment with the following noted: Doing some better.  Dep  can comes in waves and last 1-2 days wihtout pattern nor trigger. Lithium 900 made tremors constant.  Lithium 750 tolerable. Increase pramipexole to 0.5 BID definitely helped. No SE. Stress wife's health. Using propranolol 20-40 prn tremor. Asked about CBD. Plan: Lithium level too low lately at 0.4 on 750 mg daily Increase lithium again to 900/750 QOD Increase propranolol to 40 mg BID prn tremor.  He usually takes just 20 Continue pramipexole to 0.5 mg BID for off label depression.    07/04/2021 appt noted: Doing OK.  Not  consistently depressed. Did increase lithium. Wife's health still bad and  constant stress with multiple issues.  Multiple hip surgeries and dislocations.  Bulk of time is caring for her.  No real help with her.  No family here but some around.  He doesn't ask for it.  W has RA. Plan no med changes: continue lithium again to 900/750 QOD Tremor better with  propranolol to 40 mg BID  Continue pramipexole to 0.5 mg BID for off label depression.   Trazodone 200 mg HS, Xanax  mg prn  01/07/22 appt noted: A lot of change.  Depressed but it comes and goes without pattern.  Can get up feeling good and then crash.  May last a few days, not weeks.  Maybe situational but ongoing with wife and not likely to change. Wife hip replacement for RA.    It has been a disaster with 4 revisions.  He does everything at home.  She's been to rehab and got pressure sore. Trazodone works from 11-2 and awakens and may not get back to sleep 1/2 th time. TIA 1 month ago and work up negative.  SX with balance px and diff speaking fluently and numb left side.   Plan: Start Trintellix 5 then 10 mg daily.  01/09/22 called complaining of N. Encouraged to give it more time. 01/14/22 TC: N intolerable  with Trintellix and stopped it.  Plan start Auvelity when N resolves. 02/04/22 TC:  He cannot take Auvelity anymore. Side effects- Extreme dizziness, unsteady when walking, not having effect on depression either. He had increased the dose as directed and symptoms were worse.     04/24/22 appt noted: Current psychiatric meds include alprazolam 0.5 mg twice daily as needed anxiety, lamotrigine 200 mg twice daily, Cytomel 37.5 mg every morning, lithium 750 mg nightly, pramipexole 0.5 mg twice daily, propranolol 40 mg twice daily as needed tremor, trazodone 200 mg nightly, Sonata 10 mg as needed insomnia Depression about the same with >50% of the time depressed.  Fights it daily with some days better than others.  Can be having a good day at times and then drop like a rock.  Maybe stress with home situation is a  trigger. Good bit of anxiety tgool.  Infrequent Xanax.  Awake at 2 AM and often for the balance of the night.  May have to get up with wife at night.   Avg 6 hours.  No intentional nap but may doze in recliner. Asks about Sprav[place].  Son in law good response. Plan: continue lithium again to 900/750 QOD Tremor better with  propranolol to 40 mg BID  Wean pramipexole and start Vraylar 1.5 mg QOD.   Start Vraylar 1 capsule every other day Reduce pramipexole to 1/2 twice daily for 1 week then 1/2 in the AM for 1 week, then stop it. Call in a couple of weeks if not better and we'll increase it.  05/03/2022 phone call concerned about  expensive Vraylar being $800 a month and he cannot afford that.  He was encouraged to use the samples and we could consider patient to send stents if it was effective. 06/18/22 ran out of samples and concerned about cost.  Encouraged to use samples until the next appt.  But given his fear about this we can retry Abilify 2 mg daily.  07/10/22 appt noted: Taking Abilify 2 mg every morning, lamotrigine 200 mg twice daily, Cytomel 37.5 mcg every morning, lithium 600 alternating with 750 every other day, propranolol 40 mg twice daily, B6, trazodone 200 mg nightly, Sonata 10 mg as needed Vraylar clearly helped with less frequent down and shorter duration depressive periods.  No SE. Switch to Abilify has been good with same sort of benefit.  Still down times but less duration.  No SE.   Dep ranges from 5/10 to 1/10 lately.  Hasn't had the nasty depression he had before Vraylar. Function is ok right now and interest is better.    Sleep is pretty good lately and much better.  Gets up with wife once usually. Usually just takes trazodone at night. Tremor manageable but not gone. Has a lot to do.  Rare breaks for caregiving.  10/18/22 appt noted; About the same.  With good and bad days and managing ok.   Continues meds.  No SE.  Tremor managed. Sleep is fine. Still consumed with  care for wife.  Won't get better.  Her arthritis is advanced so far she requires a lot of care. He's doing all the care.  Not a lot of downtime.  Will read a good deal and yard work.  Can get away for short periods of time. Goes to [employer] occassionally.   Plan: DC Abilify and retry pramipexole and increase to 1.5-2 mg daily.  12/25/22 appt noted: Clearly things leveled off and a lot better with switch to pramipexole 1.5 mg daily.  Much better. Off Abilify. No SE.   Sleep is fine.  Up a couple of times nightly with Debbie but goes back to sleep.  Never going to be 100% with wife's problems.   Anxiety is ok except stress wife's illness which will not get better.  Minimal help.     Psych med history:  Ambien CR, trazodone, mirtazapine NR, Lunesta, doxepin, hydroxyzine, unisom, Belsomra, Seroquel, temazepam,  sonata works. propranolol Lamotrigine, Cytomel lithium,   Pramipexole 0.5 BID helped Abilify 10 mg 2009 for several months  Fluoxetine, citalopram 80, Wellbutrin, buspirone Trintellix a week with N at 5 Auvelity 2-3 weeks with dizziness, unsteady  Adderall, Strattera, Nuvigil others also Under her psychiatric care since May 2001  Review of Systems:  Review of Systems  Constitutional:  Positive for fatigue.  Cardiovascular:  Negative for palpitations.  Gastrointestinal:  Negative for diarrhea and nausea.  Endocrine: Negative for polyuria.  Neurological:  Negative for tremors.  Psychiatric/Behavioral:  Positive for dysphoric mood. Negative for agitation, behavioral problems, confusion, decreased concentration, hallucinations, self-injury, sleep disturbance and suicidal ideas. The patient is not nervous/anxious and is not hyperactive.     Medications: I have reviewed the patient's current medications.  Current Outpatient Medications  Medication Sig Dispense Refill   ALPRAZolam (XANAX) 0.5 MG tablet TAKE 1 TABLET BY MOUTH TWICE A DAY AS NEEDED FOR ANXIETY 45 tablet 1    aspirin EC 81 MG tablet Take 81 mg by mouth daily. Swallow whole.     lamoTRIgine (LAMICTAL) 200 MG tablet Take 1 tablet (200 mg total) by mouth 2 (  two) times daily. 180 tablet 1   liothyronine (CYTOMEL) 25 MCG tablet TAKE 1 AND 1/2 TABLETS(37.5 MCG) BY MOUTH DAILY 135 tablet 1   lithium carbonate 150 MG capsule TAKE 5 CAPSULES DAILY 450 capsule 0   lithium carbonate 300 MG capsule TAKE 3 CAPSULES BY MOUTH AT BEDTIME 270 capsule 0   propranolol (INDERAL) 40 MG tablet TAKE 1 TABLET (40 MG TOTAL) BY MOUTH 2 (TWO) TIMES DAILY AS NEEDED (TREMOR). 180 tablet 1   Pyridoxine HCl (VITAMIN B-6) 500 MG tablet Take 500 mg by mouth 2 (two) times a day.     traZODone (DESYREL) 100 MG tablet TAKE 2 TABLETS BY MOUTH AT BEDTIME 180 tablet 0   zaleplon (SONATA) 10 MG capsule TAKE 1 CAPSULE BY MOUTH AT NIGHT IF HE AWAKENS AND STILL HAS 4 HOURS LEFT TO SLEEP 30 capsule 1   pramipexole (MIRAPEX) 0.5 MG tablet Take 2 tablets (1 mg total) by mouth in the morning and at bedtime. 270 tablet 0   No current facility-administered medications for this visit.    Medication Side Effects: Other: tremor managed with B6  Allergies: No Known Allergies  Past Medical History:  Diagnosis Date   Adenomatous colon polyp 2003   on 2003 colonoscopy (and on 2013 colon; numerous; next 01/2015)   Bipolar disorder (HCC)    with primary depressive symptoms   Chronic headaches    Diverticulosis 2003   on colonoscopy   Melanoma (HCC) 1990   R arm   Obstructive sleep apnea    Syncope 11/16/2015   Tremor     Family History  Problem Relation Age of Onset   Hypertension Father    Sleep apnea Father    Coronary artery disease Father    Diabetes Father     Social History   Socioeconomic History   Marital status: Married    Spouse name: Debbie   Number of children: Not on file   Years of education: Not on file   Highest education level: Not on file  Occupational History   Occupation: funeral service    Employer: [employer]  Tobacco Use   Smoking status: Former    Types: Cigarettes    Quit date: 1990    Years since quitting: 34.5   Smokeless tobacco: Never  Substance and Sexual Activity   Alcohol use: No   Drug use: No   Sexual activity: Not on file  Other Topics Concern   Not on file  Social History Narrative   Caffeine: 1 serving daily   Social Determinants of Health   Financial Resource Strain: Not on file  Food Insecurity: Not on file  Transportation Needs: Not on file  Physical Activity: Not on file  Stress: Not on file  Social Connections: Not on file  Intimate Partner Violence: Not on file    Past Medical History, Surgical history, Social history, and Family history were reviewed and updated as appropriate.   Please see review of systems for further details on the patient's review from today.   Objective:   Physical Exam:  There were no vitals taken for this visit.  Physical Exam Constitutional:      General: He is not in acute distress.    Appearance: He is well-developed.  Musculoskeletal:        General: No deformity.  Neurological:     Mental Status: He is alert and oriented to person, place, and time.     Motor: No tremor.  Coordination: Coordination normal.     Gait: Gait normal.  Psychiatric:        Attention and Perception: Attention normal. He is attentive.        Mood and Affect: Mood is depressed. Mood is not anxious. Affect is not labile, blunt, angry, tearful or inappropriate.        Speech: Speech normal.        Behavior: Behavior normal.        Thought Content: Thought content normal. Thought content is not delusional. Thought content does not include homicidal or suicidal ideation. Thought content does not include suicidal plan.        Cognition and Memory: Cognition normal.        Judgment: Judgment normal.     Comments: Insight is good. Dep at least 70% better with pramipexole     Lab Review:     Component Value Date/Time   NA 140  09/25/2022 1558   K 3.6 09/25/2022 1558   CL 108 09/25/2022 1558   CO2 23 09/25/2022 1558   GLUCOSE 93 09/25/2022 1558   BUN 12 09/25/2022 1558   CREATININE 1.05 09/25/2022 1558   CALCIUM 9.2 09/25/2022 1558   PROT 5.6 (L) 12/12/2015 0702   ALBUMIN 3.8 12/12/2015 0702   AST 18 12/12/2015 0702   ALT 15 (L) 12/12/2015 0702   ALKPHOS 45 12/12/2015 0702   BILITOT 0.8 12/12/2015 0702   GFRNONAA 60 (L) 12/12/2015 0702   GFRAA >60 12/12/2015 0702       Component Value Date/Time   WBC 6.7 12/12/2015 0702   RBC 4.65 12/12/2015 0702   HGB 13.8 12/12/2015 0702   HCT 40.8 12/12/2015 0702   PLT 120 (L) 12/12/2015 0702   MCV 87.7 12/12/2015 0702   MCH 29.7 12/12/2015 0702   MCHC 33.8 12/12/2015 0702   RDW 12.7 12/12/2015 0702   LYMPHSABS 1.3 12/11/2015 1348   MONOABS 0.4 12/11/2015 1348   EOSABS 0.1 12/11/2015 1348   BASOSABS 0.0 12/11/2015 1348    Lithium Lvl  Date Value Ref Range Status  09/25/2022 0.4 (L) 0.6 - 1.2 mmol/L Final   10/31/20 Note on labs and meds: Lithium level has dropped below the therapeutic range to 0.4 on the same dose of 5 of the 150 mg capsules.   No results found for: "PHENYTOIN", "PHENOBARB", "VALPROATE", "CBMZ"   .res Assessment: Plan:    Jaymes was seen today for follow-up, depression and stress.  Diagnoses and all orders for this visit:  Bipolar I disorder, most recent episode depressed (HCC) -     pramipexole (MIRAPEX) 0.5 MG tablet; Take 2 tablets (1 mg total) by mouth in the morning and at bedtime.  Caregiver stress  Insomnia due to mental condition  Lithium-induced tremor  Lithium use   Relapse depression mostly responsive to pramipexole 1.5 mg daily. Disc dosing range on pramipexole and studies that disc this and his prior experience of benefit.    Doesn't tolerate lithium 900 dT tremor.  Supportive therapy on importance self care and getting out for himself.  Disc his guilt over conflicted feelings in detail. Disc longterm plans.   Disc care-giving stress and need for respite at times.     caretaking fatigue.  Emphasized getting breaks from this for his own physical and mental well-being.  We discussed the short-term risks associated with benzodiazepines including sedation and increased fall risk among others.  Discussed long-term side effect risk including dependence, potential withdrawal symptoms, and the potential eventual dose-related risk  of dementia.  But recent studies from 2020 dispute this association between benzodiazepines and dementia risk. Newer studies in 2020 do not support an association with dementia. Alprazolam prn 0.5 mg   Counseled patient regarding potential benefits, risks, and side effects of lithium to include potential risk of lithium affecting thyroid and renal function.  Discussed need for periodic lab monitoring to determine drug level and to assess for potential adverse effects.  Counseled patient regarding signs and symptoms of lithium toxicity and advised that they notify office immediately or seek urgent medical attention if experiencing these signs and symptoms.  Patient advised to contact office with any questions or concerns. Lithium level  0.4 on 750 mg daily  continue lithium again to 900/750 QOD Tremor better with  propranolol to 40 mg BID  Trazodone 200 mg HS Prn Sonata 10 mg HS Start pramipexole: 1 mg BID to see if can get more benefit.  Check lithium labs done 09/2022 and OK  FU 2 mos  Meredith Staggers, MD, DFAPA   Please see After Visit Summary for patient specific instructions.  No future appointments.    No orders of the defined types were placed in this encounter.      -------------------------------

## 2022-12-28 ENCOUNTER — Other Ambulatory Visit: Payer: Self-pay | Admitting: Psychiatry

## 2022-12-28 DIAGNOSIS — F3130 Bipolar disorder, current episode depressed, mild or moderate severity, unspecified: Secondary | ICD-10-CM

## 2023-01-02 ENCOUNTER — Other Ambulatory Visit: Payer: Self-pay | Admitting: Psychiatry

## 2023-01-02 DIAGNOSIS — F3130 Bipolar disorder, current episode depressed, mild or moderate severity, unspecified: Secondary | ICD-10-CM

## 2023-01-03 ENCOUNTER — Other Ambulatory Visit: Payer: Self-pay | Admitting: Psychiatry

## 2023-01-06 ENCOUNTER — Telehealth: Payer: Self-pay | Admitting: Psychiatry

## 2023-01-06 ENCOUNTER — Other Ambulatory Visit: Payer: Self-pay | Admitting: Psychiatry

## 2023-01-06 DIAGNOSIS — F3130 Bipolar disorder, current episode depressed, mild or moderate severity, unspecified: Secondary | ICD-10-CM

## 2023-01-06 NOTE — Telephone Encounter (Signed)
Pharmacy sent PA Request for Zaleplon 10mg / Sonata, see CMM

## 2023-01-06 NOTE — Telephone Encounter (Signed)
P 

## 2023-01-06 NOTE — Telephone Encounter (Signed)
Pt called and said that he needs his mirapex 0.5 mg. It looks like it went to print. Please resubmit

## 2023-01-29 ENCOUNTER — Other Ambulatory Visit: Payer: Self-pay | Admitting: Psychiatry

## 2023-01-29 DIAGNOSIS — F3130 Bipolar disorder, current episode depressed, mild or moderate severity, unspecified: Secondary | ICD-10-CM

## 2023-02-02 ENCOUNTER — Other Ambulatory Visit: Payer: Self-pay | Admitting: Psychiatry

## 2023-02-02 DIAGNOSIS — F3130 Bipolar disorder, current episode depressed, mild or moderate severity, unspecified: Secondary | ICD-10-CM

## 2023-02-10 ENCOUNTER — Telehealth: Payer: Self-pay

## 2023-02-10 NOTE — Telephone Encounter (Signed)
Prior Approval received with [employer] for Zaleplon 10 mg capsules effective through 06/17/2023

## 2023-02-10 NOTE — Telephone Encounter (Signed)
Pt's last refill for Zaleplon 10 mg capsule was 01/07/23 with private pay insurance. Trying to follow up on his PA for Zaleplon but unable to assess CMM. Pt has medicare/ [facility] [employer] is showing for processing his prescriptions.

## 2023-02-11 NOTE — Telephone Encounter (Signed)
Approved see phone note

## 2023-02-21 ENCOUNTER — Other Ambulatory Visit: Payer: Self-pay | Admitting: Psychiatry

## 2023-02-21 DIAGNOSIS — F3130 Bipolar disorder, current episode depressed, mild or moderate severity, unspecified: Secondary | ICD-10-CM

## 2023-03-17 ENCOUNTER — Other Ambulatory Visit: Payer: Self-pay | Admitting: Psychiatry

## 2023-03-17 DIAGNOSIS — F411 Generalized anxiety disorder: Secondary | ICD-10-CM

## 2023-03-17 NOTE — Telephone Encounter (Signed)
I reviewed last visits note 12/25/22, it does not say continue Xnax or to discontinue Xnax. However, it did not give an indication of change or discontinuation of Xnax on the side bar. Although, in the notes medication list, Xnax was listed on there. If we are basing the rf on that list we can rf it.  Lv 12/25/22; lf 01/16/23; nv 06/24/22

## 2023-03-25 ENCOUNTER — Ambulatory Visit: Payer: Medicare Other | Admitting: Psychiatry

## 2023-04-02 DIAGNOSIS — H26491 Other secondary cataract, right eye: Secondary | ICD-10-CM | POA: Diagnosis not present

## 2023-04-02 DIAGNOSIS — H35363 Drusen (degenerative) of macula, bilateral: Secondary | ICD-10-CM | POA: Diagnosis not present

## 2023-04-02 DIAGNOSIS — Z961 Presence of intraocular lens: Secondary | ICD-10-CM | POA: Diagnosis not present

## 2023-04-02 DIAGNOSIS — H43393 Other vitreous opacities, bilateral: Secondary | ICD-10-CM | POA: Diagnosis not present

## 2023-04-20 ENCOUNTER — Other Ambulatory Visit: Payer: Self-pay | Admitting: Psychiatry

## 2023-04-20 DIAGNOSIS — F3130 Bipolar disorder, current episode depressed, mild or moderate severity, unspecified: Secondary | ICD-10-CM

## 2023-04-21 NOTE — Telephone Encounter (Signed)
PT has appt 11/6

## 2023-04-23 ENCOUNTER — Ambulatory Visit: Payer: Medicare Other | Admitting: Psychiatry

## 2023-04-27 ENCOUNTER — Other Ambulatory Visit: Payer: Self-pay | Admitting: Psychiatry

## 2023-04-27 DIAGNOSIS — F411 Generalized anxiety disorder: Secondary | ICD-10-CM

## 2023-04-28 NOTE — Telephone Encounter (Signed)
LF 09/30; LV 7/10

## 2023-05-01 DIAGNOSIS — H43391 Other vitreous opacities, right eye: Secondary | ICD-10-CM | POA: Diagnosis not present

## 2023-05-04 ENCOUNTER — Other Ambulatory Visit: Payer: Self-pay | Admitting: Psychiatry

## 2023-05-04 DIAGNOSIS — G251 Drug-induced tremor: Secondary | ICD-10-CM

## 2023-05-05 ENCOUNTER — Other Ambulatory Visit: Payer: Self-pay | Admitting: Psychiatry

## 2023-05-05 DIAGNOSIS — F3130 Bipolar disorder, current episode depressed, mild or moderate severity, unspecified: Secondary | ICD-10-CM

## 2023-05-05 DIAGNOSIS — F5105 Insomnia due to other mental disorder: Secondary | ICD-10-CM

## 2023-05-06 ENCOUNTER — Other Ambulatory Visit: Payer: Self-pay | Admitting: Psychiatry

## 2023-05-18 ENCOUNTER — Other Ambulatory Visit: Payer: Self-pay | Admitting: Psychiatry

## 2023-05-18 DIAGNOSIS — F3130 Bipolar disorder, current episode depressed, mild or moderate severity, unspecified: Secondary | ICD-10-CM

## 2023-05-19 NOTE — Telephone Encounter (Signed)
Patient reports taking 200 mg BID.

## 2023-05-19 NOTE — Telephone Encounter (Signed)
Lamictal is not mentioned in his medications in the lv 07/10 notes. In visit 05/3 it mentions he is taking lamictal 200 bid, but after that date nothing is noted- to take or to discontinue. I did not see any telephone encounter requesting any rf's. Please advise.

## 2023-06-25 ENCOUNTER — Encounter: Payer: Self-pay | Admitting: Psychiatry

## 2023-06-25 ENCOUNTER — Ambulatory Visit: Payer: Medicare Other | Admitting: Psychiatry

## 2023-06-25 DIAGNOSIS — Z79899 Other long term (current) drug therapy: Secondary | ICD-10-CM | POA: Diagnosis not present

## 2023-06-25 DIAGNOSIS — F5105 Insomnia due to other mental disorder: Secondary | ICD-10-CM

## 2023-06-25 DIAGNOSIS — F3130 Bipolar disorder, current episode depressed, mild or moderate severity, unspecified: Secondary | ICD-10-CM

## 2023-06-25 DIAGNOSIS — Z636 Dependent relative needing care at home: Secondary | ICD-10-CM | POA: Diagnosis not present

## 2023-06-25 NOTE — Progress Notes (Signed)
 Drew Padilla [id number] 04/23/1951 73 y.o.  Subjective:   [name] is a 73 y.o. (DOB [birth year]) male.  Chief Complaint:  Chief Complaint  Patient presents with   Follow-up   Depression   Anxiety   Sleeping Problem    HPI  Drew Padilla presents to the office today for follow-up of bipolar and insomnia.  seen in January 2021.  Good physical.  No otgher meds except Rx here..  12/2019 appointment without medicine changes in the following noted: Best I can.   Stress wife's health and constant caretaking.  Taking more and more out of hime and feels sort of guilty about it.    Aggressive RA is her problem and hip replacement January 2020 without help and complications.  He's doing everything.  Waring on him emotionally.  Not like retirement expected.  Staying in.  W gradual deterioration. Xanax  a couple times of week.  Propranolol  prn tremor. Minimal tremor. Sleep good.  10/31/2020 appointment with the following noted: Last 2 mos been the darkest bleakest in years.  Doesn't know why.  More depressed.  No energy or motivation for normal activities.  Anhedonia.  Sleep unchanged but may be more naps daytime and still 7-8 hours at night.  No particular trigger except wife's health continues to worsen and he has to do more.  Occ break briefly  Does not leave for extended periods. . No SE lithium  generally. Plan:Labs from 12/2019 were normal including low normal lithium  level 0.5  Due to relapse repeat labs. Increase pramipexole  to 0.375 mg BID for off label depression.  Call in 2 weeks to report effect.  10/31/20 Note on labs and meds: Lithium  level has dropped below the therapeutic range to 0.4 on the same dose of 5 of the 150 mg capsules.  He is also relapsed into depression. At the last appointment we increased the pramipexole  because of his depression.  If he is better then he does not need to change the lithium .  If his depression is not better then he needs to  increase the lithium  to 6 of the 150 mg capsules or we can prescribe 3 of the 300 mg capsules to try to get his lithium  level up.  12/25/20 MD noted: Patient has a history of treatment resistant major depression which has largely been under control over the last several years with a combination of lithium , lamotrigine  and pramipexole .  At his last appointment in May he had relapsed into depression. We had him increase pramipexole  to 0.375 mg twice daily which he calls and reports has not been particularly helpful. We ordered a lithium  level was which was low at 0.4 and therefore it was suggested that he increase the lithium  but he has been reluctant to do so due to lithium  related tremors.  In terms of getting relief from depression, still the best option is to increase the lithium  but I understand that will result in more tremors to be managed by propranolol  or perhaps some other tremor medicine.   It is not obvious regarding the next step as there are many different options.  I do not like starting a new medication without an appointment so this put him on the cancellation list. In the meantime he can increase pramipexole  1 step further to 0.5 mg twice daily.  As we go higher it might cause some sleepiness and if that is a problem then let us  know.   04/02/2021 appointment with the following noted: Doing  some better.  Dep can comes in waves and last 1-2 days wihtout pattern nor trigger. Lithium  900 made tremors constant.  Lithium  750 tolerable. Increase pramipexole  to 0.5 BID definitely helped. No SE. Stress wife's health. Using propranolol  20-40 prn tremor. Asked about CBD. Plan: Lithium  level too low lately at 0.4 on 750 mg daily Increase lithium  again to 900/750 QOD Increase propranolol  to 40 mg BID prn tremor.  He usually takes just 20 Continue pramipexole  to 0.5 mg BID for off label depression.    07/04/2021 appt noted: Doing OK.  Not  consistently depressed. Did increase lithium . Wife's  health still bad and constant stress with multiple issues.  Multiple hip surgeries and dislocations.  Bulk of time is caring for her.  No real help with her.  No family here but some around.  He doesn't ask for it.  W has RA. Plan no med changes: continue lithium  again to 900/750 QOD Tremor better with  propranolol  to 40 mg BID  Continue pramipexole  to 0.5 mg BID for off label depression.   Trazodone  200 mg HS, Xanax   mg prn  01/07/22 appt noted: A lot of change.  Depressed but it comes and goes without pattern.  Can get up feeling good and then crash.  May last a few days, not weeks.  Maybe situational but ongoing with wife and not likely to change. Wife hip replacement for RA.    It has been a disaster with 4 revisions.  He does everything at home.  She's been to rehab and got pressure sore. Trazodone  works from 11-2 and awakens and may not get back to sleep 1/2 th time. TIA 1 month ago and work up negative.  SX with balance px and diff speaking fluently and numb left side.   Plan: Start Trintellix 5 then 10 mg daily.  01/09/22 called complaining of N. Encouraged to give it more time. 01/14/22 TC: N intolerable  with Trintellix and stopped it.  Plan start Auvelity when N resolves. 02/04/22 TC:  He cannot take Auvelity anymore. Side effects- Extreme dizziness, unsteady when walking, not having effect on depression either. He had increased the dose as directed and symptoms were worse.     04/24/22 appt noted: Current psychiatric meds include alprazolam  0.5 mg twice daily as needed anxiety, lamotrigine  200 mg twice daily, Cytomel  37.5 mg every morning, lithium  750 mg nightly, pramipexole  0.5 mg twice daily, propranolol  40 mg twice daily as needed tremor, trazodone  200 mg nightly, Sonata  10 mg as needed insomnia Depression about the same with >50% of the time depressed.  Fights it daily with some days better than others.  Can be having a good day at times and then drop like a rock.  Maybe stress with  home situation is a trigger. Good bit of anxiety tgool.  Infrequent Xanax .  Awake at 2 AM and often for the balance of the night.  May have to get up with wife at night.   Avg 6 hours.  No intentional nap but may doze in recliner. Asks about [place].  Son in law good response. Plan: continue lithium  again to 900/750 QOD Tremor better with  propranolol  to 40 mg BID  Wean pramipexole  and start Vraylar  1.5 mg QOD.   Start Vraylar  1 capsule every other day Reduce pramipexole  to 1/2 twice daily for 1 week then 1/2 in the AM for 1 week, then stop it. Call in a couple of weeks if not better and we'll increase it.  05/03/2022  phone call concerned about expensive Vraylar  being $800 a month and he cannot afford that.  He was encouraged to use the samples and we could consider patient to send stents if it was effective. 06/18/22 ran out of samples and concerned about cost.  Encouraged to use samples until the next appt.  But given his fear about this we can retry Abilify  2 mg daily.  07/10/22 appt noted: Taking Abilify  2 mg every morning, lamotrigine  200 mg twice daily, Cytomel  37.5 mcg every morning, lithium  600 alternating with 750 every other day, propranolol  40 mg twice daily, B6, trazodone  200 mg nightly, Sonata  10 mg as needed Vraylar  clearly helped with less frequent down and shorter duration depressive periods.  No SE. Switch to Abilify  has been good with same sort of benefit.  Still down times but less duration.  No SE.   Dep ranges from 5/10 to 1/10 lately.  Hasn't had the nasty depression he had before Vraylar . Function is ok right now and interest is better.    Sleep is pretty good lately and much better.  Gets up with wife once usually. Usually just takes trazodone  at night. Tremor manageable but not gone. Has a lot to do.  Rare breaks for caregiving.  10/18/22 appt noted; About the same.  With good and bad days and managing ok.   Continues meds.  No SE.  Tremor managed. Sleep is  fine. Still consumed with care for wife.  Won't get better.  Her arthritis is advanced so far she requires a lot of care. He's doing all the care.  Not a lot of downtime.  Will read a good deal and yard work.  Can get away for short periods of time. Goes to [employer] occassionally.   Plan: DC Abilify  and retry pramipexole  and increase to 1.5-2 mg daily.  12/25/22 appt noted: Clearly things leveled off and a lot better with switch to pramipexole  1.5 mg daily.  Much better. Off Abilify . No SE.   Sleep is fine.  Up a couple of times nightly with Drew Padilla but goes back to sleep.  Never going to be 100% with wife's problems.   Anxiety is ok except stress wife's illness which will not get better.  Minimal help.   Plan: continue lithium  again to 900/750 QOD Tremor better with  propranolol  to 40 mg BID  Trazodone  200 mg HS Prn Sonata  10 mg HS Start pramipexole : 1 mg BID to see if can get more benefit.  06/25/23 appt noted: Psych meds: as above without SE. Doing fine overall with good and bad days.   Sleep is ok.  Some awakening with Sonata  benefit.  Have to get up with wife and hard to get back to sleep.  Sonata  works in 15-20 mins. Tremor 75% time is ok.  Manageable overall.   Asked questions about GLP1 agonists and wt loss.  Asked about some of newer meds for bipolar like Vraylar .  Would like to reduce the frequency and severity of the down times.    Psych med history:  Ambien CR, trazodone , mirtazapine  NR, Lunesta, doxepin, hydroxyzine , unisom, Belsomra, Seroquel, temazepam,  sonata  works. propranolol  Lamotrigine , Cytomel  lithium ,   Pramipexole  0.5 BID helped Abilify  10 mg 2009 for several months  Fluoxetine, citalopram 80, Wellbutrin, buspirone Trintellix a week with N at 5 Auvelity 2-3 weeks with dizziness, unsteady  Adderall, Strattera, Nuvigil others also Under her psychiatric care since May 2001  Review of Systems:  Review of Systems  Constitutional:  Positive for  fatigue.   Cardiovascular:  Negative for palpitations.  Gastrointestinal:  Negative for diarrhea and nausea.  Endocrine: Negative for polyuria.  Neurological:  Negative for tremors.  Psychiatric/Behavioral:  Positive for dysphoric mood. Negative for agitation, behavioral problems, confusion, decreased concentration, hallucinations, self-injury, sleep disturbance and suicidal ideas. The patient is not nervous/anxious and is not hyperactive.     Medications: I have reviewed the patient's current medications.  Current Outpatient Medications  Medication Sig Dispense Refill   ALPRAZolam  (XANAX ) 0.5 MG tablet TAKE 1 TABLET BY MOUTH TWICE A DAY AS NEEDED FOR ANXIETY 45 tablet 2   aspirin EC 81 MG tablet Take 81 mg by mouth daily. Swallow whole.     lamoTRIgine  (LAMICTAL ) 200 MG tablet TAKE 1 TABLET BY MOUTH TWICE A DAY 180 tablet 0   liothyronine  (CYTOMEL ) 25 MCG tablet TAKE 1.5 TABLETS BY MOUTH ONCE DAILY 135 tablet 0   lithium  carbonate 150 MG capsule TAKE 5 CAPSULES BY MOUTH DAILY 450 capsule 0   lithium  carbonate 300 MG capsule TAKE 3 CAPSULES BY MOUTH AT BEDTIME 270 capsule 0   pramipexole  (MIRAPEX ) 0.5 MG tablet TAKE 2 TABLETS BY MOUTH TWICE A DAY 360 tablet 0   propranolol  (INDERAL ) 40 MG tablet TAKE 1 TABLET (40 MG TOTAL) BY MOUTH 2 (TWO) TIMES DAILY AS NEEDED (TREMOR). 180 tablet 1   Pyridoxine HCl (VITAMIN B-6) 500 MG tablet Take 500 mg by mouth 2 (two) times a day.     traZODone  (DESYREL ) 100 MG tablet TAKE 2 TABLETS BY MOUTH AT BEDTIME 180 tablet 0   zaleplon  (SONATA ) 10 MG capsule TAKE 1 CAPSULE BY MOUTH AT NIGHT IF HE AWAKENS AND STILL HAS 4 HOURS LEFT TO SLEEP 30 capsule 1   No current facility-administered medications for this visit.    Medication Side Effects: Other: tremor managed with B6  Allergies: No Known Allergies  Past Medical History:  Diagnosis Date   Adenomatous colon polyp 2003   on 2003 colonoscopy (and on 2013 colon; numerous; next 01/2015)   Bipolar disorder (HCC)     with primary depressive symptoms   Chronic headaches    Diverticulosis 2003   on colonoscopy   Melanoma (HCC) 1990   R arm   Obstructive sleep apnea    Syncope 11/16/2015   Tremor     Family History  Problem Relation Age of Onset   Hypertension Father    Sleep apnea Father    Coronary artery disease Father    Diabetes Father     Social History   Socioeconomic History   Marital status: Married    Spouse name: Drew Padilla   Number of children: Not on file   Years of education: Not on file   Highest education level: Not on file  Occupational History   Occupation: funeral service    Employer: [employer]  Tobacco Use   Smoking status: Former    Current packs/day: 0.00    Types: Cigarettes    Quit date: 1990    Years since quitting: 35.0   Smokeless tobacco: Never  Substance and Sexual Activity   Alcohol use: No   Drug use: No   Sexual activity: Not on file  Other Topics Concern   Not on file  Social History Narrative   Caffeine: 1 serving daily   Social Drivers of Health   Financial Resource Strain: Not on file  Food Insecurity: Not on file  Transportation Needs: Not on file  Physical Activity: Not on file  Stress: Not  on file  Social Connections: Not on file  Intimate Partner Violence: Not on file    Past Medical History, Surgical history, Social history, and Family history were reviewed and updated as appropriate.   Please see review of systems for further details on the patient's review from today.   Objective:   Physical Exam:  There were no vitals taken for this visit.  Physical Exam Constitutional:      General: He is not in acute distress.    Appearance: He is well-developed.  Musculoskeletal:        General: No deformity.  Neurological:     Mental Status: He is alert and oriented to person, place, and time.     Motor: No tremor.     Coordination: Coordination normal.     Gait: Gait normal.  Psychiatric:        Attention and Perception:  Attention normal. He is attentive.        Mood and Affect: Mood is depressed. Mood is not anxious. Affect is not labile, blunt, angry, tearful or inappropriate.        Speech: Speech normal.        Behavior: Behavior normal.        Thought Content: Thought content normal. Thought content is not delusional. Thought content does not include homicidal or suicidal ideation. Thought content does not include suicidal plan.        Cognition and Memory: Cognition normal.        Judgment: Judgment normal.     Comments: Insight is good. Dep at least 70% better with pramipexole  but not resolved.     Lab Review:     Component Value Date/Time   NA 140 09/25/2022 1558   K 3.6 09/25/2022 1558   CL 108 09/25/2022 1558   CO2 23 09/25/2022 1558   GLUCOSE 93 09/25/2022 1558   BUN 12 09/25/2022 1558   CREATININE 1.05 09/25/2022 1558   CALCIUM 9.2 09/25/2022 1558   PROT 5.6 (L) 12/12/2015 0702   ALBUMIN 3.8 12/12/2015 0702   AST 18 12/12/2015 0702   ALT 15 (L) 12/12/2015 0702   ALKPHOS 45 12/12/2015 0702   BILITOT 0.8 12/12/2015 0702   GFRNONAA 60 (L) 12/12/2015 0702   GFRAA >60 12/12/2015 0702       Component Value Date/Time   WBC 6.7 12/12/2015 0702   RBC 4.65 12/12/2015 0702   HGB 13.8 12/12/2015 0702   HCT 40.8 12/12/2015 0702   PLT 120 (L) 12/12/2015 0702   MCV 87.7 12/12/2015 0702   MCH 29.7 12/12/2015 0702   MCHC 33.8 12/12/2015 0702   RDW 12.7 12/12/2015 0702   LYMPHSABS 1.3 12/11/2015 1348   MONOABS 0.4 12/11/2015 1348   EOSABS 0.1 12/11/2015 1348   BASOSABS 0.0 12/11/2015 1348    Lithium  Lvl  Date Value Ref Range Status  09/25/2022 0.4 (L) 0.6 - 1.2 mmol/L Final   10/31/20 Note on labs and meds: Lithium  level has dropped below the therapeutic range to 0.4 on the same dose of 5 of the 150 mg capsules.   No results found for: PHENYTOIN, PHENOBARB, VALPROATE, CBMZ   .res Assessment: Plan:    Drew Padilla was seen today for follow-up, depression, anxiety and  sleeping problem.  Diagnoses and all orders for this visit:  Bipolar I disorder, most recent episode depressed (HCC)  Insomnia due to mental condition  Lithium  use  Caregiver stress    Relapse depression mostly responsive to pramipexole  1.5 mg daily. Disc dosing  range on pramipexole  and studies that disc this and his prior experience of benefit.    Doesn't tolerate lithium  900 dT tremor.  Tolerates lower dose.     caretaking fatigue.  Emphasized getting breaks from this for his own physical and mental well-being.  We discussed the short-term risks associated with benzodiazepines including sedation and increased fall risk among others.  Discussed long-term side effect risk including dependence, potential withdrawal symptoms, and the potential eventual dose-related risk of dementia.  But recent studies from 2020 dispute this association between benzodiazepines and dementia risk. Newer studies in 2020 do not support an association with dementia. Alprazolam  prn 0.5 mg   Counseled patient regarding potential benefits, risks, and side effects of lithium  to include potential risk of lithium  affecting thyroid  and renal function.  Discussed need for periodic lab monitoring to determine drug level and to assess for potential adverse effects.  Counseled patient regarding signs and symptoms of lithium  toxicity and advised that they notify office immediately or seek urgent medical attention if experiencing these signs and symptoms.  Patient advised to contact office with any questions or concerns. Lithium  level  0.4 on 750 mg daily  Discussed potential metabolic side effects associated with atypical antipsychotics, as well as potential risk for movement side effects. Advised pt to contact office if movement side effects occur.   continue lithium  again to 900/750 QOD Tremor better with  propranolol  to 40 mg BID  Trazodone  200 mg HS Prn Sonata  10 mg HS  For further depr benefit Start Vraylar  1.5  mg daily. Reduce pramipexole  to 1 of the 0.5 mg tablets twice daily for 3 days, Then reduce to 1/2 of the 0.5 mg tablet twice daily for 4 days. Then stop pramipexole .  Check lithium  labs done 09/2022 and OK  Extensive discussion of GLP1 meds and wt loss and lithium  levels.  FU 2 mos  Lorene Macintosh, MD, DFAPA   Please see After Visit Summary for patient specific instructions.  Future Appointments  Date Time Provider Department Center  08/27/2023  9:30 AM Cottle, Lorene KANDICE Raddle., MD [facility] None      No orders of the defined types were placed in this encounter.      -------------------------------

## 2023-06-25 NOTE — Patient Instructions (Signed)
 Start Vraylar 1.5 mg daily.  Reduce pramipexole to 1 of the 0.5 mg tablets twice daily for 3 days, Then reduce to 1/2 of the 0.5 mg tablet twice daily for 4 days. Then stop pramipexole.

## 2023-07-24 ENCOUNTER — Other Ambulatory Visit: Payer: Self-pay | Admitting: Psychiatry

## 2023-07-26 ENCOUNTER — Other Ambulatory Visit: Payer: Self-pay | Admitting: Psychiatry

## 2023-07-26 DIAGNOSIS — F5105 Insomnia due to other mental disorder: Secondary | ICD-10-CM

## 2023-07-27 ENCOUNTER — Other Ambulatory Visit: Payer: Self-pay | Admitting: Psychiatry

## 2023-07-27 DIAGNOSIS — F3130 Bipolar disorder, current episode depressed, mild or moderate severity, unspecified: Secondary | ICD-10-CM

## 2023-08-04 ENCOUNTER — Other Ambulatory Visit: Payer: Self-pay | Admitting: Psychiatry

## 2023-08-04 DIAGNOSIS — F3130 Bipolar disorder, current episode depressed, mild or moderate severity, unspecified: Secondary | ICD-10-CM

## 2023-08-04 MED ORDER — LITHIUM CARBONATE 300 MG PO CAPS: ORAL_CAPSULE | ORAL | 0 refills | Status: DC

## 2023-08-04 MED ORDER — LITHIUM CARBONATE 150 MG PO CAPS: ORAL_CAPSULE | ORAL | 0 refills | Status: DC

## 2023-08-05 DIAGNOSIS — H43391 Other vitreous opacities, right eye: Secondary | ICD-10-CM | POA: Diagnosis not present

## 2023-08-27 ENCOUNTER — Ambulatory Visit (INDEPENDENT_AMBULATORY_CARE_PROVIDER_SITE_OTHER): Payer: Medicare Other | Admitting: Psychiatry

## 2023-08-27 ENCOUNTER — Encounter: Payer: Self-pay | Admitting: Psychiatry

## 2023-08-27 DIAGNOSIS — Z79899 Other long term (current) drug therapy: Secondary | ICD-10-CM

## 2023-08-27 DIAGNOSIS — F5105 Insomnia due to other mental disorder: Secondary | ICD-10-CM | POA: Diagnosis not present

## 2023-08-27 DIAGNOSIS — F3130 Bipolar disorder, current episode depressed, mild or moderate severity, unspecified: Secondary | ICD-10-CM

## 2023-08-27 NOTE — Progress Notes (Signed)
 Drew Padilla [id number] 1950/08/20 73 y.o.  Subjective:   [name] is a 73 y.o. (DOB 11-22-1950) male.  Chief Complaint:  Chief Complaint  Patient presents with   Follow-up   Depression   Stress   Sleeping Problem    HPI  Drew Padilla presents to the office today for follow-up of bipolar and insomnia.  seen in January 2021.  Good physical.  No otgher meds except Rx here..  12/2019 appointment without medicine changes in the following noted: Best I can.   Stress wife's health and constant caretaking.  Taking more and more out of hime and feels sort of guilty about it.    Aggressive RA is her problem and hip replacement January 2020 without help and complications.  He's doing everything.  Waring on him emotionally.  Not like retirement expected.  Staying in.  W gradual deterioration. Xanax a couple times of week.  Propranolol prn tremor. Minimal tremor. Sleep good.  10/31/2020 appointment with the following noted: Last 2 mos been the darkest bleakest in years.  Doesn't know why.  More depressed.  No energy or motivation for normal activities.  Anhedonia.  Sleep unchanged but may be more naps daytime and still 7-8 hours at night.  No particular trigger except wife's health continues to worsen and he has to do more.  Occ break briefly  Does not leave for extended periods. . No SE lithium generally. Plan:Labs from 12/2019 were normal including low normal lithium level 0.5  Due to relapse repeat labs. Increase pramipexole to 0.375 mg BID for off label depression.  Call in 2 weeks to report effect.  10/31/20 Note on labs and meds: Lithium level has dropped below the therapeutic range to 0.4 on the same dose of 5 of the 150 mg capsules.  He is also relapsed into depression. At the last appointment we increased the pramipexole because of his depression.  If he is better then he does not need to change the lithium.  If his depression is not better then he needs to  increase the lithium to 6 of the 150 mg capsules or we can prescribe 3 of the 300 mg capsules to try to get his lithium level up.  12/25/20 MD noted: Patient has a history of treatment resistant major depression which has largely been under control over the last several years with a combination of lithium, lamotrigine and pramipexole.  At his last appointment in May he had relapsed into depression. We had him increase pramipexole to 0.375 mg twice daily which he calls and reports has not been particularly helpful. We ordered a lithium level was which was low at 0.4 and therefore it was suggested that he increase the lithium but he has been reluctant to do so due to lithium related tremors.  In terms of getting relief from depression, still the best option is to increase the lithium but I understand that will result in more tremors to be managed by propranolol or perhaps some other tremor medicine.   It is not obvious regarding the next step as there are many different options.  I do not like starting a new medication without an appointment so this put him on the cancellation list. In the meantime he can increase pramipexole 1 step further to 0.5 mg twice daily.  As we go higher it might cause some sleepiness and if that is a problem then let us know.   04/02/2021 appointment with the following noted: Doing  some better.  Dep can comes in waves and last 1-2 days wihtout pattern nor trigger. Lithium 900 made tremors constant.  Lithium 750 tolerable. Increase pramipexole to 0.5 BID definitely helped. No SE. Stress wife's health. Using propranolol 20-40 prn tremor. Asked about CBD. Plan: Lithium level too low lately at 0.4 on 750 mg daily Increase lithium again to 900/750 QOD Increase propranolol to 40 mg BID prn tremor.  He usually takes just 20 Continue pramipexole to 0.5 mg BID for off label depression.    07/04/2021 appt noted: Doing OK.  Not  consistently depressed. Did increase lithium. Wife's  health still bad and constant stress with multiple issues.  Multiple hip surgeries and dislocations.  Bulk of time is caring for her.  No real help with her.  No family here but some around.  He doesn't ask for it.  W has RA. Plan no med changes: continue lithium again to 900/750 QOD Tremor better with  propranolol to 40 mg BID  Continue pramipexole to 0.5 mg BID for off label depression.   Trazodone 200 mg HS, Xanax  mg prn  01/07/22 appt noted: A lot of change.  Depressed but it comes and goes without pattern.  Can get up feeling good and then crash.  May last a few days, not weeks.  Maybe situational but ongoing with wife and not likely to change. Wife hip replacement for RA.    It has been a disaster with 4 revisions.  He does everything at home.  She's been to rehab and got pressure sore. Trazodone works from 11-2 and awakens and may not get back to sleep 1/2 th time. TIA 1 month ago and work up negative.  SX with balance px and diff speaking fluently and numb left side.   Plan: Start Trintellix 5 then 10 mg daily.  01/09/22 called complaining of N. Encouraged to give it more time. 01/14/22 TC: N intolerable  with Trintellix and stopped it.  Plan start Auvelity when N resolves. 02/04/22 TC:  He cannot take Auvelity anymore. Side effects- Extreme dizziness, unsteady when walking, not having effect on depression either. He had increased the dose as directed and symptoms were worse.     04/24/22 appt noted: Current psychiatric meds include alprazolam 0.5 mg twice daily as needed anxiety, lamotrigine 200 mg twice daily, Cytomel 37.5 mg every morning, lithium 750 mg nightly, pramipexole 0.5 mg twice daily, propranolol 40 mg twice daily as needed tremor, trazodone 200 mg nightly, Sonata 10 mg as needed insomnia Depression about the same with >50% of the time depressed.  Fights it daily with some days better than others.  Can be having a good day at times and then drop like a rock.  Maybe stress with  home situation is a trigger. Good bit of anxiety tgool.  Infrequent Xanax.  Awake at 2 AM and often for the balance of the night.  May have to get up with wife at night.   Avg 6 hours.  No intentional nap but may doze in recliner. Asks about Spravato.  Son in law good response. Plan: continue lithium again to 900/750 QOD Tremor better with  propranolol to 40 mg BID  Wean pramipexole and start Vraylar 1.5 mg QOD.   Start Vraylar 1 capsule every other day Reduce pramipexole to 1/2 twice daily for 1 week then 1/2 in the AM for 1 week, then stop it. Call in a couple of weeks if not better and we'll increase it.  05/03/2022  phone call concerned about expensive Vraylar being $800 a month and he cannot afford that.  He was encouraged to use the samples and we could consider patient to send stents if it was effective. 06/18/22 ran out of samples and concerned about cost.  Encouraged to use samples until the next appt.  But given his fear about this we can retry Abilify 2 mg daily.  07/10/22 appt noted: Taking Abilify 2 mg every morning, lamotrigine 200 mg twice daily, Cytomel 37.5 mcg every morning, lithium 600 alternating with 750 every other day, propranolol 40 mg twice daily, B6, trazodone 200 mg nightly, Sonata 10 mg as needed Vraylar clearly helped with less frequent down and shorter duration depressive periods.  No SE. Switch to Abilify has been good with same sort of benefit.  Still down times but less duration.  No SE.   Dep ranges from 5/10 to 1/10 lately.  Hasn't had the nasty depression he had before Vraylar. Function is ok right now and interest is better.    Sleep is pretty good lately and much better.  Gets up with wife once usually. Usually just takes trazodone at night. Tremor manageable but not gone. Has a lot to do.  Rare breaks for caregiving.  10/18/22 appt noted; About the same.  With good and bad days and managing ok.   Continues meds.  No SE.  Tremor managed. Sleep is  fine. Still consumed with care for wife.  Won't get better.  Her arthritis is advanced so far she requires a lot of care. He's doing all the care.  Not a lot of downtime.  Will read a good deal and yard work.  Can get away for short periods of time. Goes to [employer] occassionally.   Plan: DC Abilify and retry pramipexole and increase to 1.5-2 mg daily.  12/25/22 appt noted: Clearly things leveled off and a lot better with switch to pramipexole 1.5 mg daily.  Much better. Off Abilify. No SE.   Sleep is fine.  Up a couple of times nightly with Debbie but goes back to sleep.  Never going to be 100% with wife's problems.   Anxiety is ok except stress wife's illness which will not get better.  Minimal help.   Plan: continue lithium again to 900/750 QOD Tremor better with  propranolol to 40 mg BID  Trazodone 200 mg HS Prn Sonata 10 mg HS Start pramipexole: 1 mg BID to see if can get more benefit.  06/25/23 appt noted: Psych meds: as above without SE. Doing fine overall with good and bad days.   Sleep is ok.  Some awakening with Sonata benefit.  Have to get up with wife and hard to get back to sleep.  Sonata works in 15-20 mins. Tremor 75% time is ok.  Manageable overall.   Asked questions about GLP1 agonists and wt loss.  Asked about some of newer meds for bipolar like Vraylar.  Would like to reduce the frequency and severity of the down times.   Plan: For further depr benefit Start Vraylar 1.5 mg daily. Reduce pramipexole to 1 of the 0.5 mg tablets twice daily for 3 days, Then reduce to 1/2 of the 0.5 mg tablet twice daily for 4 days. Then stop pramipexole.  08/27/23 appt noted: Med: Vraylar 1.5 mg daily, off pramipexole, lamotrigine 200 BID, Cytomel 37.5, lithium 900/750 mg every other day, propranolol 40 BID, B6 5oomg tablet, trazodone 200 mg HS , prn Sonata 10.  No meds from other  doctors.   No SE.   Marginally better with the switch to Vraylar.  Dep episodes lasting 1 day-3 days.   Manages and gets through and eventually dissipates.  Episodes not more than weekly.  No SI.   Propranolol helps tremor but not gone. Sonata helps when needed. To bed 11 and wake at 2 AM and sometimes needs Sonata.  Avg 7 hours sleep.    Psych med history:  Ambien CR, trazodone, mirtazapine NR, Lunesta, doxepin, hydroxyzine, unisom, Belsomra, Seroquel, temazepam,  sonata works. propranolol Lamotrigine, Cytomel  lithium,   Pramipexole 0.5 BID helped Abilify 10 mg 2009 for several months  Fluoxetine, citalopram 80, Wellbutrin, buspirone Trintellix a week with N at 5 Auvelity 2-3 weeks with dizziness, unsteady  Adderall, Strattera, Nuvigil others also Under her psychiatric care since May 2001  Review of Systems:  Review of Systems  Constitutional:  Positive for fatigue.  Cardiovascular:  Negative for chest pain and palpitations.  Gastrointestinal:  Negative for diarrhea and nausea.  Endocrine: Negative for polyuria.  Neurological:  Negative for tremors.  Psychiatric/Behavioral:  Positive for dysphoric mood. Negative for agitation, behavioral problems, confusion, decreased concentration, hallucinations, self-injury, sleep disturbance and suicidal ideas. The patient is not nervous/anxious and is not hyperactive.     Medications: I have reviewed the patient's current medications.  Current Outpatient Medications  Medication Sig Dispense Refill   ALPRAZolam (XANAX) 0.5 MG tablet TAKE 1 TABLET BY MOUTH TWICE A DAY AS NEEDED FOR ANXIETY 45 tablet 2   aspirin EC 81 MG tablet Take 81 mg by mouth daily. Swallow whole.     lamoTRIgine (LAMICTAL) 200 MG tablet TAKE 1 TABLET BY MOUTH TWICE A DAY 180 tablet 0   liothyronine (CYTOMEL) 25 MCG tablet TAKE 1 AND 1/2 TABLETS BY MOUTH ONCE DAILY 135 tablet 0   lithium carbonate 150 MG capsule 1 capsule every other day in addition to lithium 300 mg prescription 45 capsule 0   lithium carbonate 300 MG capsule 2 capsules on even days and 3 capsules on  odd days of the month 300 capsule 0   propranolol (INDERAL) 40 MG tablet TAKE 1 TABLET (40 MG TOTAL) BY MOUTH 2 (TWO) TIMES DAILY AS NEEDED (TREMOR). 180 tablet 1   Pyridoxine HCl (VITAMIN B-6) 500 MG tablet Take 500 mg by mouth 2 (two) times a day.     traZODone (DESYREL) 100 MG tablet TAKE 2 TABLETS BY MOUTH AT BEDTIME 180 tablet 0   zaleplon (SONATA) 10 MG capsule TAKE 1 CAPSULE BY MOUTH AT NIGHT IF HE AWAKENS AND STILL HAS 4 HOURS LEFT TO SLEEP 30 capsule 1   No current facility-administered medications for this visit.    Medication Side Effects: Other: tremor managed with B6  Allergies: No Known Allergies  Past Medical History:  Diagnosis Date   Adenomatous colon polyp 2003   on 2003 colonoscopy (and on 2013 colon; numerous; next 01/2015)   Bipolar disorder (HCC)    with primary depressive symptoms   Chronic headaches    Diverticulosis 2003   on colonoscopy   Melanoma (HCC) 1990   R arm   Obstructive sleep apnea    Syncope 11/16/2015   Tremor     Family History  Problem Relation Age of Onset   Hypertension Father    Sleep apnea Father    Coronary artery disease Father    Diabetes Father     Social History   Socioeconomic History   Marital status: Married    Spouse name: Eunice Blase  Number of children: Not on file   Years of education: Not on file   Highest education level: Not on file  Occupational History   Occupation: funeral service    Employer: [employer]  Tobacco Use   Smoking status: Former    Current packs/day: 0.00    Types: Cigarettes    Quit date: 1990    Years since quitting: 35.2   Smokeless tobacco: Never  Substance and Sexual Activity   Alcohol use: No   Drug use: No   Sexual activity: Not on file  Other Topics Concern   Not on file  Social History Narrative   Caffeine: 1 serving daily   Social Drivers of Corporate investment banker Strain: Not on file  Food Insecurity: Not on file  Transportation Needs: Not on file  Physical  Activity: Not on file  Stress: Not on file  Social Connections: Not on file  Intimate Partner Violence: Not on file    Past Medical History, Surgical history, Social history, and Family history were reviewed and updated as appropriate.   Please see review of systems for further details on the patient's review from today.   Objective:   Physical Exam:  There were no vitals taken for this visit.  Physical Exam Constitutional:      General: He is not in acute distress.    Appearance: He is well-developed.  Musculoskeletal:        General: No deformity.  Neurological:     Mental Status: He is alert and oriented to person, place, and time.     Motor: No tremor.     Coordination: Coordination normal.     Gait: Gait normal.  Psychiatric:        Attention and Perception: Attention normal. He is attentive.        Mood and Affect: Mood is depressed. Mood is not anxious. Affect is not labile, blunt, angry, tearful or inappropriate.        Speech: Speech normal.        Behavior: Behavior normal.        Thought Content: Thought content normal. Thought content is not delusional. Thought content does not include homicidal or suicidal ideation. Thought content does not include suicidal plan.        Cognition and Memory: Cognition normal.        Judgment: Judgment normal.     Comments: Insight is good. Dep at least 70% better but not resolved. Minimal tremor     Lab Review:     Component Value Date/Time   NA 140 09/25/2022 1558   K 3.6 09/25/2022 1558   CL 108 09/25/2022 1558   CO2 23 09/25/2022 1558   GLUCOSE 93 09/25/2022 1558   BUN 12 09/25/2022 1558   CREATININE 1.05 09/25/2022 1558   CALCIUM 9.2 09/25/2022 1558   PROT 5.6 (L) 12/12/2015 0702   ALBUMIN 3.8 12/12/2015 0702   AST 18 12/12/2015 0702   ALT 15 (L) 12/12/2015 0702   ALKPHOS 45 12/12/2015 0702   BILITOT 0.8 12/12/2015 0702   GFRNONAA 60 (L) 12/12/2015 0702   GFRAA >60 12/12/2015 0702       Component Value  Date/Time   WBC 6.7 12/12/2015 0702   RBC 4.65 12/12/2015 0702   HGB 13.8 12/12/2015 0702   HCT 40.8 12/12/2015 0702   PLT 120 (L) 12/12/2015 0702   MCV 87.7 12/12/2015 0702   MCH 29.7 12/12/2015 0702   MCHC 33.8 12/12/2015 1610  RDW 12.7 12/12/2015 0702   LYMPHSABS 1.3 12/11/2015 1348   MONOABS 0.4 12/11/2015 1348   EOSABS 0.1 12/11/2015 1348   BASOSABS 0.0 12/11/2015 1348    Lithium Lvl  Date Value Ref Range Status  09/25/2022 0.4 (L) 0.6 - 1.2 mmol/L Final   10/31/20 Note on labs and meds: Lithium level has dropped below the therapeutic range to 0.4 on the same dose of 5 of the 150 mg capsules.   No results found for: "PHENYTOIN", "PHENOBARB", "VALPROATE", "CBMZ"   .res Assessment: Plan:    Drew "Kathlene November" was seen today for follow-up, depression, stress and sleeping problem.  Diagnoses and all orders for this visit:  Bipolar I disorder, most recent episode depressed (HCC) -     Lithium level -     Basic metabolic panel -     TSH  Insomnia due to mental condition  Lithium use -     Lithium level -     Basic metabolic panel -     TSH   40 min trial. Disc dosing range on pramipexole and studies that disc this and his prior experience of benefit.    Doesn't tolerate lithium 900 dT tremor.  Tolerates lower dose.     caretaking fatigue.  Emphasized getting breaks from this for his own physical and mental well-being.  We discussed the short-term risks associated with benzodiazepines including sedation and increased fall risk among others.  Discussed long-term side effect risk including dependence, potential withdrawal symptoms, and the potential eventual dose-related risk of dementia.  But recent studies from 2020 dispute this association between benzodiazepines and dementia risk. Newer studies in 2020 do not support an association with dementia. Alprazolam prn 0.5 mg   Counseled patient regarding potential benefits, risks, and side effects of lithium to include  potential risk of lithium affecting thyroid and renal function.  Discussed need for periodic lab monitoring to determine drug level and to assess for potential adverse effects.  Counseled patient regarding signs and symptoms of lithium toxicity and advised that they notify office immediately or seek urgent medical attention if experiencing these signs and symptoms.  Patient advised to contact office with any questions or concerns. Lithium level  0.4 on 750 mg daily  Discussed potential metabolic side effects associated with atypical antipsychotics, as well as potential risk for movement side effects. Advised pt to contact office if movement side effects occur.   Tremor better with  propranolol to 40 mg BID  Trazodone 200 mg HS Prn Sonata 10 mg HS  continue Vraylar 1.5 mg daily as mood stabilizer.  Check lithium level and labs before taper , thenn Wants to try to wean lithium and see if can get by with less med. Reduce lithium to 600 mg alternating with 900 mg ever other day for 2 weeks,  Then reduce lithium to 600 mg daily for 2 weeks, Then reduce to 450 mg daily for 2 weeks,  Then reduce to 300 mg daily for 2 weeks ,  Then reduce to 150 mg daily for 2 weeks, Then stop lithium Disc risk relapse SI , mania mood swings.  Wants to reduce meds and get rid of tremor .  Perhaps energy cog might be better off lithium.    Extensive discussion of GLP1 meds and wt loss and lithium levels.  FU 2 mos  Meredith Staggers, MD, DFAPA   Please see After Visit Summary for patient specific instructions.  No future appointments.     Orders Placed This Encounter  Procedures   Lithium level   Basic metabolic panel   TSH       -------------------------------

## 2023-08-27 NOTE — Patient Instructions (Addendum)
 Check lithium level and labs before taper , then Reduce lithium to 600 mg alternating with 900 mg ever other day for 2 weeks,  Then reduce lithium to 600 mg daily for 2 weeks, Then reduce to 450 mg daily for 2 weeks,  Then reduce to 300 mg daily for 2 weeks ,  Then reduce to 150 mg daily for 2 weeks, Then stop lithium

## 2023-08-28 ENCOUNTER — Other Ambulatory Visit: Payer: Self-pay | Admitting: Psychiatry

## 2023-08-28 DIAGNOSIS — F3130 Bipolar disorder, current episode depressed, mild or moderate severity, unspecified: Secondary | ICD-10-CM

## 2023-08-28 LAB — BASIC METABOLIC PANEL
BUN: 15 mg/dL (ref 7–25)
CO2: 26 mmol/L (ref 20–32)
Calcium: 9.4 mg/dL (ref 8.6–10.3)
Chloride: 104 mmol/L (ref 98–110)
Creat: 1.16 mg/dL (ref 0.70–1.28)
Glucose, Bld: 72 mg/dL (ref 65–139)
Potassium: 4.2 mmol/L (ref 3.5–5.3)
Sodium: 137 mmol/L (ref 135–146)

## 2023-08-28 LAB — TSH: TSH: 0.55 m[IU]/L (ref 0.40–4.50)

## 2023-08-28 LAB — LITHIUM LEVEL: Lithium Lvl: 0.6 mmol/L (ref 0.6–1.2)

## 2023-09-01 ENCOUNTER — Other Ambulatory Visit: Payer: Self-pay | Admitting: Psychiatry

## 2023-09-01 DIAGNOSIS — F411 Generalized anxiety disorder: Secondary | ICD-10-CM

## 2023-09-07 ENCOUNTER — Other Ambulatory Visit: Payer: Self-pay | Admitting: Psychiatry

## 2023-09-07 DIAGNOSIS — F3130 Bipolar disorder, current episode depressed, mild or moderate severity, unspecified: Secondary | ICD-10-CM

## 2023-09-10 DIAGNOSIS — L570 Actinic keratosis: Secondary | ICD-10-CM | POA: Diagnosis not present

## 2023-09-10 DIAGNOSIS — X32XXXA Exposure to sunlight, initial encounter: Secondary | ICD-10-CM | POA: Diagnosis not present

## 2023-09-10 DIAGNOSIS — B078 Other viral warts: Secondary | ICD-10-CM | POA: Diagnosis not present

## 2023-09-10 DIAGNOSIS — L821 Other seborrheic keratosis: Secondary | ICD-10-CM | POA: Diagnosis not present

## 2023-09-17 ENCOUNTER — Telehealth: Payer: Self-pay | Admitting: Psychiatry

## 2023-09-17 NOTE — Telephone Encounter (Signed)
 Yes if needed we can increase propranolol to 60 BID but I will need a BP and pulse reading first before increasing it

## 2023-09-17 NOTE — Telephone Encounter (Signed)
 I agree stop the Vraylar immediately and resume the full dose of lithium 900 alternating with 750 mg every other day.  Once he is back on that dosage for a few days if he feels that he can tolerate increasing the lithium to 900 mg daily then do so.  His last lithium level was 0.7 on that original dose of lithium.  He might get a little bit more benefit taking 900 mg daily.  The drawback is the tremor might be worse on 900 mg daily.  But from the blood level standpoint he should be able to tolerate it. We have other options for depression we can explore the next time I see him.  If he is not getting better back on the lithium then we should try to move up his appointment.

## 2023-09-17 NOTE — Telephone Encounter (Signed)
 Drew Padilla called at 11:00 to report that he is having some issues with the Vraylar.  Please call to discuss.  Appt 5/21

## 2023-09-17 NOTE — Telephone Encounter (Addendum)
 Called patient and reviewed recommendations with him to stop Vraylar and go back to lithium. Patient reports that the tremor has been an issue previously. He is prescribed propranolol 40 mg, 1 tab BID prn, but reports he has needed it routinely BID. He asks if bumping up the dose would be an option.

## 2023-09-17 NOTE — Telephone Encounter (Signed)
 Pt reporting the Leafy Kindle is "just not working." He said he ran out of samples about 2-3 days ago. He said he has had more and longer periods of depression on the Vraylar. He is asking to go back to lithium 900/750.

## 2023-09-18 NOTE — Telephone Encounter (Signed)
 Patient notified

## 2023-10-09 ENCOUNTER — Other Ambulatory Visit: Payer: Self-pay | Admitting: Psychiatry

## 2023-10-25 ENCOUNTER — Other Ambulatory Visit: Payer: Self-pay | Admitting: Psychiatry

## 2023-10-25 DIAGNOSIS — F3130 Bipolar disorder, current episode depressed, mild or moderate severity, unspecified: Secondary | ICD-10-CM

## 2023-10-25 DIAGNOSIS — F5105 Insomnia due to other mental disorder: Secondary | ICD-10-CM

## 2023-10-29 ENCOUNTER — Other Ambulatory Visit: Payer: Self-pay | Admitting: Psychiatry

## 2023-10-29 DIAGNOSIS — G251 Drug-induced tremor: Secondary | ICD-10-CM

## 2023-11-05 ENCOUNTER — Ambulatory Visit (INDEPENDENT_AMBULATORY_CARE_PROVIDER_SITE_OTHER): Admitting: Psychiatry

## 2023-11-05 ENCOUNTER — Encounter: Payer: Self-pay | Admitting: Psychiatry

## 2023-11-05 ENCOUNTER — Other Ambulatory Visit: Payer: Self-pay | Admitting: Psychiatry

## 2023-11-05 DIAGNOSIS — Z636 Dependent relative needing care at home: Secondary | ICD-10-CM | POA: Diagnosis not present

## 2023-11-05 DIAGNOSIS — F5105 Insomnia due to other mental disorder: Secondary | ICD-10-CM | POA: Diagnosis not present

## 2023-11-05 DIAGNOSIS — Z79899 Other long term (current) drug therapy: Secondary | ICD-10-CM | POA: Diagnosis not present

## 2023-11-05 DIAGNOSIS — G251 Drug-induced tremor: Secondary | ICD-10-CM

## 2023-11-05 DIAGNOSIS — F3130 Bipolar disorder, current episode depressed, mild or moderate severity, unspecified: Secondary | ICD-10-CM | POA: Diagnosis not present

## 2023-11-05 MED ORDER — PRAMIPEXOLE DIHYDROCHLORIDE 0.5 MG PO TABS: ORAL_TABLET | ORAL | 0 refills | Status: DC

## 2023-11-05 MED ORDER — PROPRANOLOL HCL 60 MG PO TABS: 60.0000 mg | ORAL_TABLET | Freq: Two times a day (BID) | ORAL | 0 refills | Status: DC

## 2023-11-05 NOTE — Progress Notes (Addendum)
 Drew Padilla [id number] 1950/07/24 73 y.o.  Subjective:   [name] is a 73 y.o. (DOB [birth year]) male.  Chief Complaint:  Chief Complaint  Patient presents with   Follow-up   Depression   Medication Reaction    HPI  Drew Padilla presents to the office today for follow-up of bipolar and insomnia.  seen in January 2021.  Good physical.  No otgher meds except Rx here..  12/2019 appointment without medicine changes in the following noted: Best I can.   Stress wife's health and constant caretaking.  Taking more and more out of hime and feels sort of guilty about it.    Aggressive RA is her problem and hip replacement January 2020 without help and complications.  He's doing everything.  Waring on him emotionally.  Not like retirement expected.  Staying in.  W gradual deterioration. Xanax  a couple times of week.  Propranolol  prn tremor. Minimal tremor. Sleep good.  10/31/2020 appointment with the following noted: Last 2 mos been the darkest bleakest in years.  Doesn't know why.  More depressed.  No energy or motivation for normal activities.  Anhedonia.  Sleep unchanged but may be more naps daytime and still 7-8 hours at night.  No particular trigger except wife's health continues to worsen and he has to do more.  Occ break briefly  Does not leave for extended periods. . No SE lithium  generally. Plan:Labs from 12/2019 were normal including low normal lithium  level 0.5  Due to relapse repeat labs. Increase pramipexole  to 0.375 mg BID for off label depression.  Call in 2 weeks to report effect.  10/31/20 Note on labs and meds: Lithium  level has dropped below the therapeutic range to 0.4 on the same dose of 5 of the 150 mg capsules.  He is also relapsed into depression. At the last appointment we increased the pramipexole  because of his depression.  If he is better then he does not need to change the lithium .  If his depression is not better then he needs to increase  the lithium  to 6 of the 150 mg capsules or we can prescribe 3 of the 300 mg capsules to try to get his lithium  level up.  12/25/20 MD noted: Patient has a history of treatment resistant major depression which has largely been under control over the last several years with a combination of lithium , lamotrigine  and pramipexole .  At his last appointment in May he had relapsed into depression. We had him increase pramipexole  to 0.375 mg twice daily which he calls and reports has not been particularly helpful. We ordered a lithium  level was which was low at 0.4 and therefore it was suggested that he increase the lithium  but he has been reluctant to do so due to lithium  related tremors.  In terms of getting relief from depression, still the best option is to increase the lithium  but I understand that will result in more tremors to be managed by propranolol  or perhaps some other tremor medicine.   It is not obvious regarding the next step as there are many different options.  I do not like starting a new medication without an appointment so this put him on the cancellation list. In the meantime he can increase pramipexole  1 step further to 0.5 mg twice daily.  As we go higher it might cause some sleepiness and if that is a problem then let us  know.   04/02/2021 appointment with the following noted: Doing some better.  Dep can comes in waves and last 1-2 days wihtout pattern nor trigger. Lithium  900 made tremors constant.  Lithium  750 tolerable. Increase pramipexole  to 0.5 BID definitely helped. No SE. Stress wife's health. Using propranolol  20-40 prn tremor. Asked about CBD. Plan: Lithium  level too low lately at 0.4 on 750 mg daily Increase lithium  again to 900/750 QOD Increase propranolol  to 40 mg BID prn tremor.  He usually takes just 20 Continue pramipexole  to 0.5 mg BID for off label depression.    07/04/2021 appt noted: Doing OK.  Not  consistently depressed. Did increase lithium . Wife's health  still bad and constant stress with multiple issues.  Multiple hip surgeries and dislocations.  Bulk of time is caring for her.  No real help with her.  No family here but some around.  He doesn't ask for it.  W has RA. Plan no med changes: continue lithium  again to 900/750 QOD Tremor better with  propranolol  to 40 mg BID  Continue pramipexole  to 0.5 mg BID for off label depression.   Trazodone  200 mg HS, Xanax   mg prn  01/07/22 appt noted: A lot of change.  Depressed but it comes and goes without pattern.  Can get up feeling good and then crash.  May last a few days, not weeks.  Maybe situational but ongoing with wife and not likely to change. Wife hip replacement for RA.    It has been a disaster with 4 revisions.  He does everything at home.  She's been to rehab and got pressure sore. Trazodone  works from 11-2 and awakens and may not get back to sleep 1/2 th time. TIA 1 month ago and work up negative.  SX with balance px and diff speaking fluently and numb left side.   Plan: Start Trintellix 5 then 10 mg daily.  01/09/22 called complaining of N. Encouraged to give it more time. 01/14/22 TC: N intolerable  with Trintellix and stopped it.  Plan start Auvelity when N resolves. 02/04/22 TC:  He cannot take Auvelity anymore. Side effects- Extreme dizziness, unsteady when walking, not having effect on depression either. He had increased the dose as directed and symptoms were worse.     04/24/22 appt noted: Current psychiatric meds include alprazolam  0.5 mg twice daily as needed anxiety, lamotrigine  200 mg twice daily, Cytomel  37.5 mg every morning, lithium  750 mg nightly, pramipexole  0.5 mg twice daily, propranolol  40 mg twice daily as needed tremor, trazodone  200 mg nightly, Sonata  10 mg as needed insomnia Depression about the same with >50% of the time depressed.  Fights it daily with some days better than others.  Can be having a good day at times and then drop like a rock.  Maybe stress with home  situation is a trigger. Good bit of anxiety tgool.  Infrequent Xanax .  Awake at 2 AM and often for the balance of the night.  May have to get up with wife at night.   Avg 6 hours.  No intentional nap but may doze in recliner. Asks about Spravato.  Son in law good response. Plan: continue lithium  again to 900/750 QOD Tremor better with  propranolol  to 40 mg BID  Wean pramipexole  and start Vraylar  1.5 mg QOD.   Start Vraylar  1 capsule every other day Reduce pramipexole  to 1/2 twice daily for 1 week then 1/2 in the AM for 1 week, then stop it. Call in a couple of weeks if not better and we'll increase it.  05/03/2022 phone call concerned  about expensive Vraylar  being $800 a month and he cannot afford that.  He was encouraged to use the samples and we could consider patient to send stents if it was effective. 06/18/22 ran out of samples and concerned about cost.  Encouraged to use samples until the next appt.  But given his fear about this we can retry Abilify  2 mg daily.  07/10/22 appt noted: Taking Abilify  2 mg every morning, lamotrigine  200 mg twice daily, Cytomel  37.5 mcg every morning, lithium  600 alternating with 750 every other day, propranolol  40 mg twice daily, B6, trazodone  200 mg nightly, Sonata  10 mg as needed Vraylar  clearly helped with less frequent down and shorter duration depressive periods.  No SE. Switch to Abilify  has been good with same sort of benefit.  Still down times but less duration.  No SE.   Dep ranges from 5/10 to 1/10 lately.  Hasn't had the nasty depression he had before Vraylar . Function is ok right now and interest is better.    Sleep is pretty good lately and much better.  Gets up with wife once usually. Usually just takes trazodone  at night. Tremor manageable but not gone. Has a lot to do.  Rare breaks for caregiving.  10/18/22 appt noted; About the same.  With good and bad days and managing ok.   Continues meds.  No SE.  Tremor managed. Sleep is fine. Still  consumed with care for wife.  Won't get better.  Her arthritis is advanced so far she requires a lot of care. He's doing all the care.  Not a lot of downtime.  Will read a good deal and yard work.  Can get away for short periods of time. Goes to [employer] occassionally.   Plan: DC Abilify  and retry pramipexole  and increase to 1.5-2 mg daily.  12/25/22 appt noted: Clearly things leveled off and a lot better with switch to pramipexole  1.5 mg daily.  Much better. Off Abilify . No SE.   Sleep is fine.  Up a couple of times nightly with Debbie but goes back to sleep.  Never going to be 100% with wife's problems.   Anxiety is ok except stress wife's illness which will not get better.  Minimal help.   Plan: continue lithium  again to 900/750 QOD Tremor better with  propranolol  to 40 mg BID  Trazodone  200 mg HS Prn Sonata  10 mg HS Start pramipexole : 1 mg BID to see if can get more benefit.  06/25/23 appt noted: Psych meds: as above without SE. Doing fine overall with good and bad days.   Sleep is ok.  Some awakening with Sonata  benefit.  Have to get up with wife and hard to get back to sleep.  Sonata  works in 15-20 mins. Tremor 75% time is ok.  Manageable overall.   Asked questions about GLP1 agonists and wt loss.  Asked about some of newer meds for bipolar like Vraylar .  Would like to reduce the frequency and severity of the down times.   Plan: For further depr benefit Start Vraylar  1.5 mg daily. Reduce pramipexole  to 1 of the 0.5 mg tablets twice daily for 3 days, Then reduce to 1/2 of the 0.5 mg tablet twice daily for 4 days. Then stop pramipexole .  08/27/23 appt noted: Med: Vraylar  1.5 mg daily, off pramipexole , lamotrigine  200 BID, Cytomel  37.5, lithium  900/750 mg every other day, propranolol  40 BID, B6 5oomg tablet, trazodone  200 mg HS , prn Sonata  10.  No meds from other doctors.  No SE.   Marginally better with the switch to Vraylar .  Dep episodes lasting 1 day-3 days.  Manages and  gets through and eventually dissipates.  Episodes not more than weekly.  No SI.   Propranolol  helps tremor but not gone. Sonata  helps when needed. To bed 11 and wake at 2 AM and sometimes needs Sonata .  Avg 7 hours sleep. Plan: wean lithium   11/05/23 appt noted:  Med: lithium  900/750 mg every other day, propranolol  40 BID, B6 5oomg tablet, trazodone  200 mg HS , lamotrigine  200 BID, Cytomel  37.5, no Vraylar , pramipexole  0.5 mg tab 2 tab BID Vraylar  experiment without lithium  failed with more dep.  Current meds 5 weeks.  Mood pretty good but periods of bad dep lasting upt to a week but then eased off.   No SE.   No new changes sought but would like less med.   Normal BP and pulse.  Psych med history:  Ambien CR, trazodone , mirtazapine  NR, Lunesta, doxepin, hydroxyzine , unisom, Belsomra, Seroquel, temazepam,  sonata  works. Propranolol  40 BID Lamotrigine , Cytomel   lithium ,   Pramipexole  0.5 BID helped Abilify  10 mg 2009 for several months  Fluoxetine, citalopram 80, Wellbutrin, buspirone Trintellix a week with N at 5 Auvelity 2-3 weeks with dizziness, unsteady  Adderall, Strattera, Nuvigil others also Under her psychiatric care since May 2001  Review of Systems:  Review of Systems  Constitutional:  Positive for fatigue.  Cardiovascular:  Negative for chest pain and palpitations.  Gastrointestinal:  Negative for diarrhea and nausea.  Endocrine: Negative for polyuria.  Neurological:  Negative for tremors.  Psychiatric/Behavioral:  Positive for dysphoric mood. Negative for agitation, behavioral problems, confusion, decreased concentration, hallucinations, self-injury, sleep disturbance and suicidal ideas. The patient is not nervous/anxious and is not hyperactive.     Medications: I have reviewed the patient's current medications.  Current Outpatient Medications  Medication Sig Dispense Refill   ALPRAZolam  (XANAX ) 0.5 MG tablet TAKE 1 TABLET BY MOUTH TWICE A DAY AS NEEDED FOR ANXIETY  45 tablet 2   aspirin EC 81 MG tablet Take 81 mg by mouth daily. Swallow whole.     lamoTRIgine  (LAMICTAL ) 200 MG tablet TAKE 1 TABLET BY MOUTH TWICE A DAY 180 tablet 0   liothyronine  (CYTOMEL ) 25 MCG tablet TAKE 1 AND 1/2 TABLETS BY MOUTH ONCE DAILY 45 tablet 0   lithium  carbonate 150 MG capsule 1 capsule every other day in addition to lithium  300 mg prescription 45 capsule 0   lithium  carbonate 300 MG capsule 2 capsules on even days and 3 capsules on odd days of the month 300 capsule 0   Pyridoxine HCl (VITAMIN B-6) 500 MG tablet Take 500 mg by mouth 2 (two) times a day.     traZODone  (DESYREL ) 100 MG tablet TAKE 2 TABLETS BY MOUTH AT BEDTIME 60 tablet 0   zaleplon  (SONATA ) 10 MG capsule TAKE 1 CAPSULE BY MOUTH AT NIGHT IF HE AWAKENS AND STILL HAS 4 HOURS LEFT TO SLEEP 30 capsule 0   pramipexole  (MIRAPEX ) 0.5 MG tablet 1 and 1/2 tablets twice daily for 2 weeks, Then if needed, increase to 2 tablets twice daily 120 tablet 0   propranolol  (INDERAL ) 60 MG tablet Take 1 tablet (60 mg total) by mouth 2 (two) times daily. 180 tablet 0   No current facility-administered medications for this visit.    Medication Side Effects: Other: tremor managed with B6  Allergies: No Known Allergies  Past Medical History:  Diagnosis Date   Adenomatous colon polyp 2003  on 2003 colonoscopy (and on 2013 colon; numerous; next 01/2015)   Bipolar disorder (HCC)    with primary depressive symptoms   Chronic headaches    Diverticulosis 2003   on colonoscopy   Melanoma (HCC) 1990   R arm   Obstructive sleep apnea    Syncope 11/16/2015   Tremor     Family History  Problem Relation Age of Onset   Hypertension Father    Sleep apnea Father    Coronary artery disease Father    Diabetes Father     Social History   Socioeconomic History   Marital status: Married    Spouse name: Debbie   Number of children: Not on file   Years of education: Not on file   Highest education level: Not on file   Occupational History   Occupation: funeral service    Employer: [employer]  Tobacco Use   Smoking status: Former    Current packs/day: 0.00    Types: Cigarettes    Quit date: 1990    Years since quitting: 35.4   Smokeless tobacco: Never  Substance and Sexual Activity   Alcohol use: No   Drug use: No   Sexual activity: Not on file  Other Topics Concern   Not on file  Social History Narrative   Caffeine: 1 serving daily   Social Drivers of Corporate investment banker Strain: Not on file  Food Insecurity: Not on file  Transportation Needs: Not on file  Physical Activity: Not on file  Stress: Not on file  Social Connections: Not on file  Intimate Partner Violence: Not on file    Past Medical History, Surgical history, Social history, and Family history were reviewed and updated as appropriate.   Please see review of systems for further details on the patient's review from today.   Objective:   Physical Exam:  There were no vitals taken for this visit.  Physical Exam Constitutional:      General: He is not in acute distress.    Appearance: He is well-developed.  Musculoskeletal:        General: No deformity.  Neurological:     Mental Status: He is alert and oriented to person, place, and time.     Motor: No tremor.     Coordination: Coordination normal.     Gait: Gait normal.  Psychiatric:        Attention and Perception: Attention normal. He is attentive.        Mood and Affect: Mood is depressed. Mood is not anxious. Affect is not labile, blunt, angry, tearful or inappropriate.        Speech: Speech normal.        Behavior: Behavior normal.        Thought Content: Thought content normal. Thought content is not delusional. Thought content does not include homicidal or suicidal ideation. Thought content does not include suicidal plan.        Cognition and Memory: Cognition normal.        Judgment: Judgment normal.     Comments: Insight is good. Dep at  least 70% better but not resolved. Minimal tremor     Lab Review:     Component Value Date/Time   NA 137 08/27/2023 1405   K 4.2 08/27/2023 1405   CL 104 08/27/2023 1405   CO2 26 08/27/2023 1405   GLUCOSE 72 08/27/2023 1405   BUN 15 08/27/2023 1405   CREATININE 1.16 08/27/2023 1405   CALCIUM  9.4 08/27/2023 1405   PROT 5.6 (L) 12/12/2015 0702   ALBUMIN 3.8 12/12/2015 0702   AST 18 12/12/2015 0702   ALT 15 (L) 12/12/2015 0702   ALKPHOS 45 12/12/2015 0702   BILITOT 0.8 12/12/2015 0702   GFRNONAA 60 (L) 12/12/2015 0702   GFRAA >60 12/12/2015 0702       Component Value Date/Time   WBC 6.7 12/12/2015 0702   RBC 4.65 12/12/2015 0702   HGB 13.8 12/12/2015 0702   HCT 40.8 12/12/2015 0702   PLT 120 (L) 12/12/2015 0702   MCV 87.7 12/12/2015 0702   MCH 29.7 12/12/2015 0702   MCHC 33.8 12/12/2015 0702   RDW 12.7 12/12/2015 0702   LYMPHSABS 1.3 12/11/2015 1348   MONOABS 0.4 12/11/2015 1348   EOSABS 0.1 12/11/2015 1348   BASOSABS 0.0 12/11/2015 1348    Lithium  Lvl  Date Value Ref Range Status  08/27/2023 0.6 0.6 - 1.2 mmol/L Final   10/31/20 Note on labs and meds: Lithium  level has dropped below the therapeutic range to 0.4 on the same dose of 5 of the 150 mg capsules.   No results found for: PHENYTOIN, PHENOBARB, VALPROATE, CBMZ   .res Assessment: Plan:    Drew Padilla was seen today for follow-up, depression and medication reaction.  Diagnoses and all orders for this visit:  Bipolar I disorder, most recent episode depressed (HCC) -     pramipexole  (MIRAPEX ) 0.5 MG tablet; 1 and 1/2 tablets twice daily for 2 weeks, Then if needed, increase to 2 tablets twice daily  Lithium -induced tremor -     propranolol  (INDERAL ) 60 MG tablet; Take 1 tablet (60 mg total) by mouth 2 (two) times daily.  Insomnia due to mental condition  Lithium  use  Caregiver stress    40 min trial. Disc dosing range on pramipexole  and studies that disc this and his prior experience  of benefit.   Still cycles of dep and Vraylar  faile without lithium .  Doesn't tolerate lithium  900 dT tremor.  Tolerates lower dose.     caretaking fatigue.  Emphasized getting breaks from this for his own physical and mental well-being.  We discussed the short-term risks associated with benzodiazepines including sedation and increased fall risk among others.  Discussed long-term side effect risk including dependence, potential withdrawal symptoms, and the potential eventual dose-related risk of dementia.  But recent studies from 2020 dispute this association between benzodiazepines and dementia risk. Newer studies in 2020 do not support an association with dementia. Alprazolam  prn 0.5 mg   Counseled patient regarding potential benefits, risks, and side effects of lithium  to include potential risk of lithium  affecting thyroid  and renal function.  Discussed need for periodic lab monitoring to determine drug level and to assess for potential adverse effects.  Counseled patient regarding signs and symptoms of lithium  toxicity and advised that they notify office immediately or seek urgent medical attention if experiencing these signs and symptoms.  Patient advised to contact office with any questions or concerns. Lithium  level  0.4 on 750 mg daily  Discussed potential metabolic side effects associated with atypical antipsychotics, as well as potential risk for movement side effects. Advised pt to contact office if movement side effects occur.   Tremor lost benefit with  propranolol  to 40 mg BID, so increase to 60 BID  Continue lamotrigine  200 mg BID Trazodone  200 mg HS Prn Sonata  10 mg HS  Pramipexole  0.5 mg tablets , 1 and 1/2 tablets twice daily for 2 weeks,  Then if needed, increase  to 2 tablets twice daily Disc SE risk including mania, impulsivity, etc. Off label for TRD  Extensive discussion of GLP1 meds and wt loss and lithium  levels.  FU 2 mos  Nori Beat, MD, DFAPA   Please see  After Visit Summary for patient specific instructions.  No future appointments.     No orders of the defined types were placed in this encounter.      -------------------------------

## 2023-11-05 NOTE — Patient Instructions (Addendum)
 Pramipexole  0.5 mg tablets , 1 and 1/2 tablets twice daily for 2 weeks,  Then if needed, increase to 2 tablets twice daily  Increase propranolol  to 60 mg twice daily for tremor

## 2023-11-06 ENCOUNTER — Telehealth: Payer: Self-pay | Admitting: Psychiatry

## 2023-11-06 NOTE — Telephone Encounter (Signed)
 Pt was seen by Dr Toi Foster yesterday.  He said his pharmacy sent him a message that the Pramipexole  was denied.  Confirmed pharmacy is [facility] [id number].  Pls call to find out what the issue is.  Next appt 7/23

## 2023-11-06 NOTE — Telephone Encounter (Signed)
 Dr. Toi Foster sent in RF yesterday. Later a RF request was received thru the pharmacy interface and I denied it. Talked to the pharmacy this morning and they do have Dr. Guillermo Lees script and will fill it today. Patient was notified.

## 2023-11-22 ENCOUNTER — Other Ambulatory Visit: Payer: Self-pay | Admitting: Psychiatry

## 2023-11-22 DIAGNOSIS — F5105 Insomnia due to other mental disorder: Secondary | ICD-10-CM

## 2023-11-22 DIAGNOSIS — F3130 Bipolar disorder, current episode depressed, mild or moderate severity, unspecified: Secondary | ICD-10-CM

## 2023-11-28 ENCOUNTER — Other Ambulatory Visit: Payer: Self-pay | Admitting: Psychiatry

## 2023-11-28 DIAGNOSIS — F3130 Bipolar disorder, current episode depressed, mild or moderate severity, unspecified: Secondary | ICD-10-CM

## 2023-11-29 ENCOUNTER — Other Ambulatory Visit: Payer: Self-pay | Admitting: Psychiatry

## 2023-11-29 DIAGNOSIS — F3130 Bipolar disorder, current episode depressed, mild or moderate severity, unspecified: Secondary | ICD-10-CM

## 2023-11-30 NOTE — Telephone Encounter (Signed)
Is he still taking?

## 2023-12-18 DIAGNOSIS — H9193 Unspecified hearing loss, bilateral: Secondary | ICD-10-CM | POA: Diagnosis not present

## 2023-12-18 DIAGNOSIS — H6120 Impacted cerumen, unspecified ear: Secondary | ICD-10-CM | POA: Diagnosis not present

## 2023-12-20 ENCOUNTER — Other Ambulatory Visit: Payer: Self-pay | Admitting: Psychiatry

## 2023-12-20 DIAGNOSIS — F5105 Insomnia due to other mental disorder: Secondary | ICD-10-CM

## 2023-12-20 DIAGNOSIS — F3130 Bipolar disorder, current episode depressed, mild or moderate severity, unspecified: Secondary | ICD-10-CM

## 2024-01-07 ENCOUNTER — Ambulatory Visit (INDEPENDENT_AMBULATORY_CARE_PROVIDER_SITE_OTHER): Admitting: Psychiatry

## 2024-01-18 ENCOUNTER — Other Ambulatory Visit: Payer: Self-pay | Admitting: Psychiatry

## 2024-01-18 DIAGNOSIS — F5105 Insomnia due to other mental disorder: Secondary | ICD-10-CM

## 2024-01-22 ENCOUNTER — Other Ambulatory Visit: Payer: Self-pay | Admitting: Psychiatry

## 2024-01-22 DIAGNOSIS — F3130 Bipolar disorder, current episode depressed, mild or moderate severity, unspecified: Secondary | ICD-10-CM

## 2024-02-01 ENCOUNTER — Other Ambulatory Visit: Payer: Self-pay | Admitting: Psychiatry

## 2024-02-01 DIAGNOSIS — G251 Drug-induced tremor: Secondary | ICD-10-CM

## 2024-02-17 ENCOUNTER — Other Ambulatory Visit: Payer: Self-pay | Admitting: Psychiatry

## 2024-02-17 DIAGNOSIS — F3130 Bipolar disorder, current episode depressed, mild or moderate severity, unspecified: Secondary | ICD-10-CM

## 2024-03-08 ENCOUNTER — Other Ambulatory Visit: Payer: Self-pay | Admitting: Psychiatry

## 2024-03-08 DIAGNOSIS — F3130 Bipolar disorder, current episode depressed, mild or moderate severity, unspecified: Secondary | ICD-10-CM

## 2024-03-15 ENCOUNTER — Other Ambulatory Visit: Payer: Self-pay | Admitting: Psychiatry

## 2024-03-15 DIAGNOSIS — F411 Generalized anxiety disorder: Secondary | ICD-10-CM

## 2024-03-17 ENCOUNTER — Encounter: Payer: Self-pay | Admitting: Psychiatry

## 2024-03-17 ENCOUNTER — Ambulatory Visit: Admitting: Psychiatry

## 2024-03-17 DIAGNOSIS — Z636 Dependent relative needing care at home: Secondary | ICD-10-CM

## 2024-03-17 DIAGNOSIS — F411 Generalized anxiety disorder: Secondary | ICD-10-CM | POA: Diagnosis not present

## 2024-03-17 DIAGNOSIS — Z79899 Other long term (current) drug therapy: Secondary | ICD-10-CM | POA: Diagnosis not present

## 2024-03-17 DIAGNOSIS — G251 Drug-induced tremor: Secondary | ICD-10-CM | POA: Diagnosis not present

## 2024-03-17 DIAGNOSIS — F5105 Insomnia due to other mental disorder: Secondary | ICD-10-CM | POA: Diagnosis not present

## 2024-03-17 DIAGNOSIS — F3130 Bipolar disorder, current episode depressed, mild or moderate severity, unspecified: Secondary | ICD-10-CM

## 2024-03-17 MED ORDER — PROPRANOLOL HCL 60 MG PO TABS: 60.0000 mg | ORAL_TABLET | Freq: Two times a day (BID) | ORAL | 1 refills | Status: AC

## 2024-03-17 MED ORDER — LITHIUM CARBONATE 300 MG PO CAPS: ORAL_CAPSULE | ORAL | 0 refills | Status: AC

## 2024-03-17 MED ORDER — LAMOTRIGINE 200 MG PO TABS: 200.0000 mg | ORAL_TABLET | Freq: Two times a day (BID) | ORAL | 0 refills | Status: AC

## 2024-03-17 MED ORDER — PRAMIPEXOLE DIHYDROCHLORIDE 1 MG PO TABS: 1.0000 mg | ORAL_TABLET | Freq: Two times a day (BID) | ORAL | 1 refills | Status: AC

## 2024-03-17 MED ORDER — LITHIUM CARBONATE 150 MG PO CAPS: ORAL_CAPSULE | ORAL | 0 refills | Status: AC

## 2024-03-17 NOTE — Progress Notes (Addendum)
 CAMIL WILHELMSEN [id number] Apr 15, 1951 73 y.o.  Subjective:   [name] is a 73 y.o. (DOB 12-06-1950) male.  Chief Complaint:  Chief Complaint  Patient presents with   Follow-up   Depression   Stress   Sleeping Problem    HPI  VASILY FEDEWA presents to the office today for follow-up of bipolar and insomnia.  seen in January 2021.  Good physical.  No otgher meds except Rx here..  12/2019 appointment without medicine changes in the following noted: Best I can.   Stress wife's health and constant caretaking.  Taking more and more out of hime and feels sort of guilty about it.    Aggressive RA is her problem and hip replacement January 2020 without help and complications.  He's doing everything.  Waring on him emotionally.  Not like retirement expected.  Staying in.  W gradual deterioration. Xanax  a couple times of week.  Propranolol  prn tremor. Minimal tremor. Sleep good.  10/31/2020 appointment with the following noted: Last 2 mos been the darkest bleakest in years.  Doesn't know why.  More depressed.  No energy or motivation for normal activities.  Anhedonia.  Sleep unchanged but may be more naps daytime and still 7-8 hours at night.  No particular trigger except wife's health continues to worsen and he has to do more.  Occ break briefly  Does not leave for extended periods. . No SE lithium  generally. Plan:Labs from 12/2019 were normal including low normal lithium  level 0.5  Due to relapse repeat labs. Increase pramipexole  to 0.375 mg BID for off label depression.  Call in 2 weeks to report effect.  10/31/20 Note on labs and meds: Lithium  level has dropped below the therapeutic range to 0.4 on the same dose of 5 of the 150 mg capsules.  He is also relapsed into depression. At the last appointment we increased the pramipexole  because of his depression.  If he is better then he does not need to change the lithium .  If his depression is not better then he needs to  increase the lithium  to 6 of the 150 mg capsules or we can prescribe 3 of the 300 mg capsules to try to get his lithium  level up.  12/25/20 MD noted: Patient has a history of treatment resistant major depression which has largely been under control over the last several years with a combination of lithium , lamotrigine  and pramipexole .  At his last appointment in May he had relapsed into depression. We had him increase pramipexole  to 0.375 mg twice daily which he calls and reports has not been particularly helpful. We ordered a lithium  level was which was low at 0.4 and therefore it was suggested that he increase the lithium  but he has been reluctant to do so due to lithium  related tremors.  In terms of getting relief from depression, still the best option is to increase the lithium  but I understand that will result in more tremors to be managed by propranolol  or perhaps some other tremor medicine.   It is not obvious regarding the next step as there are many different options.  I do not like starting a new medication without an appointment so this put him on the cancellation list. In the meantime he can increase pramipexole  1 step further to 0.5 mg twice daily.  As we go higher it might cause some sleepiness and if that is a problem then let us  know.   04/02/2021 appointment with the following noted: Doing  some better.  Dep can comes in waves and last 1-2 days wihtout pattern nor trigger. Lithium  900 made tremors constant.  Lithium  750 tolerable. Increase pramipexole  to 0.5 BID definitely helped. No SE. Stress wife's health. Using propranolol  20-40 prn tremor. Asked about CBD. Plan: Lithium  level too low lately at 0.4 on 750 mg daily Increase lithium  again to 900/750 QOD Increase propranolol  to 40 mg BID prn tremor.  He usually takes just 20 Continue pramipexole  to 0.5 mg BID for off label depression.    07/04/2021 appt noted: Doing OK.  Not  consistently depressed. Did increase lithium . Wife's  health still bad and constant stress with multiple issues.  Multiple hip surgeries and dislocations.  Bulk of time is caring for her.  No real help with her.  No family here but some around.  He doesn't ask for it.  W has RA. Plan no med changes: continue lithium  again to 900/750 QOD Tremor better with  propranolol  to 40 mg BID  Continue pramipexole  to 0.5 mg BID for off label depression.   Trazodone  200 mg HS, Xanax   mg prn  01/07/22 appt noted: A lot of change.  Depressed but it comes and goes without pattern.  Can get up feeling good and then crash.  May last a few days, not weeks.  Maybe situational but ongoing with wife and not likely to change. Wife hip replacement for RA.    It has been a disaster with 4 revisions.  He does everything at home.  She's been to rehab and got pressure sore. Trazodone  works from 11-2 and awakens and may not get back to sleep 1/2 th time. TIA 1 month ago and work up negative.  SX with balance px and diff speaking fluently and numb left side.   Plan: Start Trintellix 5 then 10 mg daily.  01/09/22 called complaining of N. Encouraged to give it more time. 01/14/22 TC: N intolerable  with Trintellix and stopped it.  Plan start Auvelity when N resolves. 02/04/22 TC:  He cannot take Auvelity anymore. Side effects- Extreme dizziness, unsteady when walking, not having effect on depression either. He had increased the dose as directed and symptoms were worse.     04/24/22 appt noted: Current psychiatric meds include alprazolam  0.5 mg twice daily as needed anxiety, lamotrigine  200 mg twice daily, Cytomel  37.5 mg every morning, lithium  750 mg nightly, pramipexole  0.5 mg twice daily, propranolol  40 mg twice daily as needed tremor, trazodone  200 mg nightly, Sonata  10 mg as needed insomnia Depression about the same with >50% of the time depressed.  Fights it daily with some days better than others.  Can be having a good day at times and then drop like a rock.  Maybe stress with  home situation is a trigger. Good bit of anxiety tgool.  Infrequent Xanax .  Awake at 2 AM and often for the balance of the night.  May have to get up with wife at night.   Avg 6 hours.  No intentional nap but may doze in recliner. Asks about [place]rav[place].  Son in law good response. Plan: continue lithium  again to 900/750 QOD Tremor better with  propranolol  to 40 mg BID  Wean pramipexole  and start Vraylar  1.5 mg QOD.   Start Vraylar  1 capsule every other day Reduce pramipexole  to 1/2 twice daily for 1 week then 1/2 in the AM for 1 week, then stop it. Call in a couple of weeks if not better and we'll increase it.  05/03/2022  phone call concerned about expensive Vraylar  being $800 a month and he cannot afford that.  He was encouraged to use the samples and we could consider patient to send stents if it was effective. 06/18/22 ran out of samples and concerned about cost.  Encouraged to use samples until the next appt.  But given his fear about this we can retry Abilify  2 mg daily.  07/10/22 appt noted: Taking Abilify  2 mg every morning, lamotrigine  200 mg twice daily, Cytomel  37.5 mcg every morning, lithium  600 alternating with 750 every other day, propranolol  40 mg twice daily, B6, trazodone  200 mg nightly, Sonata  10 mg as needed Vraylar  clearly helped with less frequent down and shorter duration depressive periods.  No SE. Switch to Abilify  has been good with same sort of benefit.  Still down times but less duration.  No SE.   Dep ranges from 5/10 to 1/10 lately.  Hasn't had the nasty depression he had before Vraylar . Function is ok right now and interest is better.    Sleep is pretty good lately and much better.  Gets up with wife once usually. Usually just takes trazodone  at night. Tremor manageable but not gone. Has a lot to do.  Rare breaks for caregiving.  10/18/22 appt noted; About the same.  With good and bad days and managing ok.   Continues meds.  No SE.  Tremor managed. Sleep is  fine. Still consumed with care for wife.  Won't get better.  Her arthritis is advanced so far she requires a lot of care. He's doing all the care.  Not a lot of downtime.  Will read a good deal and yard work.  Can get away for short periods of time. Goes to [employer] occassionally.   Plan: DC Abilify  and retry pramipexole  and increase to 1.5-2 mg daily.  12/25/22 appt noted: Clearly things leveled off and a lot better with switch to pramipexole  1.5 mg daily.  Much better. Off Abilify . No SE.   Sleep is fine.  Up a couple of times nightly with Debbie but goes back to sleep.  Never going to be 100% with wife's problems.   Anxiety is ok except stress wife's illness which will not get better.  Minimal help.   Plan: continue lithium  again to 900/750 QOD Tremor better with  propranolol  to 40 mg BID  Trazodone  200 mg HS Prn Sonata  10 mg HS Start pramipexole : 1 mg BID to see if can get more benefit.  06/25/23 appt noted: Psych meds: as above without SE. Doing fine overall with good and bad days.   Sleep is ok.  Some awakening with Sonata  benefit.  Have to get up with wife and hard to get back to sleep.  Sonata  works in 15-20 mins. Tremor 75% time is ok.  Manageable overall.   Asked questions about GLP1 agonists and wt loss.  Asked about some of newer meds for bipolar like Vraylar .  Would like to reduce the frequency and severity of the down times.   Plan: For further depr benefit Start Vraylar  1.5 mg daily. Reduce pramipexole  to 1 of the 0.5 mg tablets twice daily for 3 days, Then reduce to 1/2 of the 0.5 mg tablet twice daily for 4 days. Then stop pramipexole .  08/27/23 appt noted: Med: Vraylar  1.5 mg daily, off pramipexole , lamotrigine  200 BID, Cytomel  37.5, lithium  900/750 mg every other day, propranolol  40 BID, B6 5oomg tablet, trazodone  200 mg HS , prn Sonata  10.  No meds from other  doctors.   No SE.   Marginally better with the switch to Vraylar .  Dep episodes lasting 1 day-3 days.   Manages and gets through and eventually dissipates.  Episodes not more than weekly.  No SI.   Propranolol  helps tremor but not gone. Sonata  helps when needed. To bed 11 and wake at 2 AM and sometimes needs Sonata .  Avg 7 hours sleep. Plan: wean lithium   11/05/23 appt noted:  Med: lithium  900/750 mg every other day, propranolol  40 BID, B6 5oomg tablet, trazodone  200 mg HS , lamotrigine  200 BID, Cytomel  37.5, no Vraylar , pramipexole  0.5 mg tab 2 tab BID Vraylar  experiment without lithium  failed with more dep.  Current meds 5 weeks.  Mood pretty good but periods of bad dep lasting upt to a week but then eased off.   No SE.   No new changes sought but would like less med.   Normal BP and pulse.   03/17/24 appt noted:  Med: lithium  900/750 mg every other day, propranolol  40 BID, B6 5oomg tablet, trazodone  200 mg HS , lamotrigine  200 BID, Cytomel  37.5,  pramipexole  0.5 mg tab 2 tab BID He thinks it helped to resume pramipexole .  Balanced against situation with wife continuing to decline.  24/7 help needed for her.  Sister occ helps him.   No other help.  Debbie fallen twice in last month.  No paid help but could do it.   No SE.   No med is going to change the home situation. Gets her to bed and then reads which helps.  The only time I have. Sleep is good until gets called. D and GD in [place].  Psych med history:  Ambien CR, trazodone , mirtazapine  NR, Lunesta, doxepin, hydroxyzine , unisom, Belsomra, Seroquel, temazepam,  sonata  works. Propranolol  40 BID Lamotrigine , Cytomel   lithium ,   Pramipexole  0.5 BID helped Abilify  10 mg 2009 for several months  Fluoxetine, citalopram 80, Wellbutrin, buspirone Trintellix a week with N at 5 Auvelity 2-3 weeks with dizziness, unsteady  Adderall, Strattera, Nuvigil others also Under her psychiatric care since May 2001  Review of Systems:  Review of Systems  Constitutional:  Positive for fatigue.  Cardiovascular:  Negative for chest pain and  palpitations.  Gastrointestinal:  Negative for diarrhea and nausea.  Endocrine: Negative for polyuria.  Neurological:  Negative for tremors.  Psychiatric/Behavioral:  Positive for dysphoric mood. Negative for agitation, behavioral problems, confusion, decreased concentration, hallucinations, self-injury, sleep disturbance and suicidal ideas. The patient is not nervous/anxious and is not hyperactive.     Medications: I have reviewed the patient's current medications.  Current Outpatient Medications  Medication Sig Dispense Refill   ALPRAZolam  (XANAX ) 0.5 MG tablet TAKE 1 TABLET BY MOUTH TWICE A DAY AS NEEDED FOR ANXIETY 45 tablet 0   aspirin EC 81 MG tablet Take 81 mg by mouth daily. Swallow whole.     liothyronine  (CYTOMEL ) 25 MCG tablet TAKE 1 AND 1/2 TABLETS BY MOUTH ONCE DAILY 135 tablet 0   Pyridoxine HCl (VITAMIN B-6) 500 MG tablet Take 500 mg by mouth 2 (two) times a day.     traZODone  (DESYREL ) 100 MG tablet TAKE 2 TABLETS BY MOUTH AT BEDTIME 120 tablet 0   zaleplon  (SONATA ) 10 MG capsule TAKE 1 CAPSULE BY MOUTH AT NIGHT IF HE AWAKENS AND STILL HAS 4 HOURS LEFT TO SLEEP 30 capsule 1   lamoTRIgine  (LAMICTAL ) 200 MG tablet Take 1 tablet (200 mg total) by mouth 2 (two) times daily. 180 tablet 0  lithium  carbonate 150 MG capsule 1 capsule every other day in addition to lithium  300 mg prescription 45 capsule 0   lithium  carbonate 300 MG capsule 2 capsules on even days and 3 capsules on odd days of the month 300 capsule 0   pramipexole  (MIRAPEX ) 1 MG tablet Take 1 tablet (1 mg total) by mouth 2 (two) times daily. 180 tablet 1   propranolol  (INDERAL ) 60 MG tablet Take 1 tablet (60 mg total) by mouth 2 (two) times daily. 180 tablet 1   No current facility-administered medications for this visit.    Medication Side Effects: Other: tremor managed with B6  Allergies: No Known Allergies  Past Medical History:  Diagnosis Date   Adenomatous colon polyp 2003   on 2003 colonoscopy (and on  2013 colon; numerous; next 01/2015)   Bipolar disorder (HCC)    with primary depressive symptoms   Chronic headaches    Diverticulosis 2003   on colonoscopy   Melanoma (HCC) 1990   R arm   Obstructive sleep apnea    Syncope 11/16/2015   Tremor     Family History  Problem Relation Age of Onset   Hypertension Father    Sleep apnea Father    Coronary artery disease Father    Diabetes Father     Social History   Socioeconomic History   Marital status: Married    Spouse name: Debbie   Number of children: Not on file   Years of education: Not on file   Highest education level: Not on file  Occupational History   Occupation: funeral service    Employer: [employer]  Tobacco Use   Smoking status: Former    Current packs/day: 0.00    Types: Cigarettes    Quit date: 1990    Years since quitting: 35.7   Smokeless tobacco: Never  Substance and Sexual Activity   Alcohol use: No   Drug use: No   Sexual activity: Not on file  Other Topics Concern   Not on file  Social History Narrative   Caffeine: 1 serving daily   Social Drivers of Corporate investment banker Strain: Not on file  Food Insecurity: Not on file  Transportation Needs: Not on file  Physical Activity: Not on file  Stress: Not on file  Social Connections: Not on file  Intimate Partner Violence: Not on file    Past Medical History, Surgical history, Social history, and Family history were reviewed and updated as appropriate.   Please see review of systems for further details on the patient's review from today.   Objective:   Physical Exam:  There were no vitals taken for this visit.  Physical Exam Constitutional:      General: He is not in acute distress.    Appearance: He is well-developed.  Musculoskeletal:        General: No deformity.  Neurological:     Mental Status: He is alert and oriented to person, place, and time.     Motor: No tremor.     Coordination: Coordination normal.      Gait: Gait normal.  Psychiatric:        Attention and Perception: Attention normal. He is attentive.        Mood and Affect: Mood is depressed. Mood is not anxious. Affect is not labile, blunt, angry, tearful or inappropriate.        Speech: Speech normal.        Behavior: Behavior normal.  Thought Content: Thought content normal. Thought content is not delusional. Thought content does not include homicidal or suicidal ideation. Thought content does not include suicidal plan.        Cognition and Memory: Cognition normal.        Judgment: Judgment normal.     Comments: Insight is good. Dep at least 80% better  Better affect with more expression after increase pramipexole  Minimal tremor     Lab Review:     Component Value Date/Time   NA 137 08/27/2023 1405   K 4.2 08/27/2023 1405   CL 104 08/27/2023 1405   CO2 26 08/27/2023 1405   GLUCOSE 72 08/27/2023 1405   BUN 15 08/27/2023 1405   CREATININE 1.16 08/27/2023 1405   CALCIUM 9.4 08/27/2023 1405   PROT 5.6 (L) 12/12/2015 0702   ALBUMIN 3.8 12/12/2015 0702   AST 18 12/12/2015 0702   ALT 15 (L) 12/12/2015 0702   ALKPHOS 45 12/12/2015 0702   BILITOT 0.8 12/12/2015 0702   GFRNONAA 60 (L) 12/12/2015 0702   GFRAA >60 12/12/2015 0702       Component Value Date/Time   WBC 6.7 12/12/2015 0702   RBC 4.65 12/12/2015 0702   HGB 13.8 12/12/2015 0702   HCT 40.8 12/12/2015 0702   PLT 120 (L) 12/12/2015 0702   MCV 87.7 12/12/2015 0702   MCH 29.7 12/12/2015 0702   MCHC 33.8 12/12/2015 0702   RDW 12.7 12/12/2015 0702   LYMPHSABS 1.3 12/11/2015 1348   MONOABS 0.4 12/11/2015 1348   EOSABS 0.1 12/11/2015 1348   BASOSABS 0.0 12/11/2015 1348    Lithium  Lvl  Date Value Ref Range Status  08/27/2023 0.6 0.6 - 1.2 mmol/L Final   10/31/20 Note on labs and meds: Lithium  level has dropped below the therapeutic range to 0.4 on the same dose of 5 of the 150 mg capsules.   No results found for: PHENYTOIN, PHENOBARB, VALPROATE,  CBMZ   .res Assessment: Plan:    Lc Joynt was seen today for follow-up, depression, stress and sleeping problem.  Diagnoses and all orders for this visit:  Bipolar I disorder, most recent episode depressed (HCC) -     pramipexole  (MIRAPEX ) 1 MG tablet; Take 1 tablet (1 mg total) by mouth 2 (two) times daily. -     lithium  carbonate 150 MG capsule; 1 capsule every other day in addition to lithium  300 mg prescription -     lithium  carbonate 300 MG capsule; 2 capsules on even days and 3 capsules on odd days of the month -     lamoTRIgine  (LAMICTAL ) 200 MG tablet; Take 1 tablet (200 mg total) by mouth 2 (two) times daily. -     Lithium  level  Anxiety state  Insomnia due to mental condition  Lithium -induced tremor -     propranolol  (INDERAL ) 60 MG tablet; Take 1 tablet (60 mg total) by mouth 2 (two) times daily.  Caregiver stress  Lithium  use   30 min with pt Disc dosing range on pramipexole  and studies that disc this and his prior experience of benefit.   Still cycles of dep and Vraylar  failed without lithium .  Doesn't tolerate lithium  900 dT tremor.  Tolerates lower dose.     caretaking fatigue.  Emphasized getting breaks from this for his own physical and mental well-being.  We discussed the short-term risks associated with benzodiazepines including sedation and increased fall risk among others.  Discussed long-term side effect risk including dependence, potential withdrawal symptoms, and the potential eventual  dose-related risk of dementia.  But recent studies from 2020 dispute this association between benzodiazepines and dementia risk. Newer studies in 2020 do not support an association with dementia. Alprazolam  prn 0.5 mg   Counseled patient regarding potential benefits, risks, and side effects of lithium  to include potential risk of lithium  affecting thyroid  and renal function.  Discussed need for periodic lab monitoring to determine drug level and to assess for  potential adverse effects.  Counseled patient regarding signs and symptoms of lithium  toxicity and advised that they notify office immediately or seek urgent medical attention if experiencing these signs and symptoms.  Patient advised to contact office with any questions or concerns. Lithium  level  0.4 on 750 mg daily  Tremor lost benefit with  propranolol  to 40 mg BID, so increase to 60 BID  Continue lamotrigine  200 mg BID Trazodone  200 mg HS Prn Sonata  10 mg HS  Pramipexole  1 mg tablets twice daily Disc SE risk including mania, impulsivity, etc. Off label for TRD and it has helped more after the increase.  Extensive discussion of GLP1 meds and wt loss and lithium  levels.  No med changes today  FU 2 mos  Lorene Macintosh, MD, DFAPA   Please see After Visit Summary for patient specific instructions.  No future appointments.     Orders Placed This Encounter  Procedures   Lithium  level       -------------------------------

## 2024-03-18 ENCOUNTER — Other Ambulatory Visit: Payer: Self-pay | Admitting: Psychiatry

## 2024-03-18 DIAGNOSIS — F5105 Insomnia due to other mental disorder: Secondary | ICD-10-CM

## 2024-03-22 ENCOUNTER — Telehealth: Payer: Self-pay | Admitting: Psychiatry

## 2024-03-22 NOTE — Telephone Encounter (Signed)
 I want him to take the dose that is most helpful  .  My understanding is that dose is 1 mg BID.  In the past he took 0.5 mg tablets BID.  If he's taking a higher dose and wants to continue it then ok.  So let me know the dose he finds most helpful.

## 2024-03-22 NOTE — Telephone Encounter (Signed)
 Pt last seen 10/1. He said he thought pramipexole  was supposed to be 2 mg BID. He said he understood it wrong or you documented it incorrectly.  Pramipexole  1 mg tablets twice daily Disc SE risk including mania, impulsivity, etc. Off label for TRD and it has helped more after the increase

## 2024-03-22 NOTE — Telephone Encounter (Signed)
 PT called and said that at his appt on 03/17/24. He thought dr.Cottle increased his mirapex  1 mg 2 in am and 2 in the evening. The script that was sent in was what had been taking before. So he would like clarification he suppose to do the increase or stay where he is at. Please give him a call at [phone]

## 2024-03-23 NOTE — Telephone Encounter (Signed)
 Pt reports it had been discussed to increase dose and he is going to take 2 mg BID. He will let us  know in a week how he is doing.

## 2024-04-17 ENCOUNTER — Other Ambulatory Visit: Payer: Self-pay | Admitting: Psychiatry

## 2024-04-17 DIAGNOSIS — F411 Generalized anxiety disorder: Secondary | ICD-10-CM

## 2024-05-20 ENCOUNTER — Other Ambulatory Visit: Payer: Self-pay | Admitting: Psychiatry

## 2024-05-20 DIAGNOSIS — F3130 Bipolar disorder, current episode depressed, mild or moderate severity, unspecified: Secondary | ICD-10-CM

## 2024-06-02 ENCOUNTER — Other Ambulatory Visit: Payer: Self-pay | Admitting: Psychiatry

## 2024-06-02 DIAGNOSIS — F411 Generalized anxiety disorder: Secondary | ICD-10-CM

## 2024-06-14 ENCOUNTER — Other Ambulatory Visit: Payer: Self-pay | Admitting: Psychiatry

## 2024-06-14 DIAGNOSIS — F5105 Insomnia due to other mental disorder: Secondary | ICD-10-CM

## 2024-09-15 ENCOUNTER — Ambulatory Visit: Admitting: Psychiatry

## 2024-09-22 ENCOUNTER — Ambulatory Visit: Admitting: Psychiatry
